# Patient Record
Sex: Male | Born: 1955 | Race: White | Hispanic: No | Marital: Married | Smoking: Never smoker
Health system: Southern US, Community
[De-identification: ages and names within clinical notes are randomized; demographics above are authoritative.]

## PROBLEM LIST (undated history)

## (undated) DIAGNOSIS — I499 Cardiac arrhythmia, unspecified: Secondary | ICD-10-CM

## (undated) DIAGNOSIS — I4891 Unspecified atrial fibrillation: Secondary | ICD-10-CM

## (undated) DIAGNOSIS — M1990 Unspecified osteoarthritis, unspecified site: Secondary | ICD-10-CM

## (undated) HISTORY — PX: SHOULDER ARTHROSCOPY: SHX128

## (undated) HISTORY — PX: CARDIOVERSION: SHX1299

---

## 2003-10-19 ENCOUNTER — Other Ambulatory Visit: Payer: Self-pay

## 2007-03-18 ENCOUNTER — Emergency Department: Payer: Self-pay | Admitting: Emergency Medicine

## 2007-03-27 ENCOUNTER — Other Ambulatory Visit: Payer: Self-pay

## 2007-03-27 ENCOUNTER — Ambulatory Visit: Payer: Self-pay | Admitting: Unknown Physician Specialty

## 2007-06-10 ENCOUNTER — Other Ambulatory Visit: Payer: Self-pay

## 2007-06-10 ENCOUNTER — Inpatient Hospital Stay: Payer: Self-pay | Admitting: Cardiology

## 2007-06-11 ENCOUNTER — Other Ambulatory Visit: Payer: Self-pay

## 2007-07-14 ENCOUNTER — Other Ambulatory Visit: Payer: Self-pay

## 2007-07-14 ENCOUNTER — Ambulatory Visit: Payer: Self-pay | Admitting: Cardiology

## 2007-07-18 ENCOUNTER — Ambulatory Visit: Payer: Self-pay | Admitting: Unknown Physician Specialty

## 2008-11-19 ENCOUNTER — Ambulatory Visit: Payer: Self-pay | Admitting: Cardiology

## 2009-08-02 ENCOUNTER — Ambulatory Visit: Payer: Self-pay | Admitting: Cardiology

## 2009-10-13 ENCOUNTER — Ambulatory Visit: Payer: Self-pay | Admitting: Cardiology

## 2009-11-02 ENCOUNTER — Ambulatory Visit: Payer: Self-pay | Admitting: Cardiology

## 2011-05-31 ENCOUNTER — Ambulatory Visit: Payer: Self-pay | Admitting: Unknown Physician Specialty

## 2012-07-19 ENCOUNTER — Emergency Department: Payer: Self-pay | Admitting: Internal Medicine

## 2012-08-07 ENCOUNTER — Ambulatory Visit: Payer: Self-pay | Admitting: Orthopedic Surgery

## 2012-08-18 ENCOUNTER — Ambulatory Visit: Payer: Self-pay | Admitting: Orthopedic Surgery

## 2012-08-18 LAB — CBC
HCT: 44.7 % (ref 40.0–52.0)
HGB: 14.7 g/dL (ref 13.0–18.0)
MCH: 28.9 pg (ref 26.0–34.0)
MCV: 88 fL (ref 80–100)
RBC: 5.07 10*6/uL (ref 4.40–5.90)

## 2012-08-18 LAB — URINALYSIS, COMPLETE
Bacteria: NONE SEEN
Bilirubin,UR: NEGATIVE
Glucose,UR: NEGATIVE mg/dL (ref 0–75)
Ketone: NEGATIVE
RBC,UR: <1 /HPF (ref 0–5)
Specific Gravity: 1.02 (ref 1.003–1.030)
Squamous Epithelial: NONE SEEN
WBC UR: NONE SEEN /HPF (ref 0–5)

## 2012-08-18 LAB — BASIC METABOLIC PANEL
Anion Gap: 8 (ref 7–16)
BUN: 18 mg/dL (ref 7–18)
Calcium, Total: 9.3 mg/dL (ref 8.5–10.1)
Chloride: 106 mmol/L (ref 98–107)
EGFR (African American): >60
EGFR (Non-African Amer.): >60
Glucose: 73 mg/dL (ref 65–99)
Potassium: 4.1 mmol/L (ref 3.5–5.1)

## 2012-08-18 LAB — APTT: Activated PTT: 28.6 secs (ref 23.6–35.9)

## 2012-08-18 LAB — SEDIMENTATION RATE: Erythrocyte Sed Rate: 6 mm/hr (ref 0–20)

## 2012-08-22 ENCOUNTER — Ambulatory Visit: Payer: Self-pay | Admitting: Orthopedic Surgery

## 2013-11-06 ENCOUNTER — Ambulatory Visit: Payer: Self-pay | Admitting: Unknown Physician Specialty

## 2013-11-23 LAB — PATHOLOGY REPORT

## 2014-07-17 ENCOUNTER — Emergency Department: Payer: Self-pay | Admitting: Internal Medicine

## 2015-03-21 ENCOUNTER — Encounter (HOSPITAL_BASED_OUTPATIENT_CLINIC_OR_DEPARTMENT_OTHER): Payer: Self-pay | Admitting: *Deleted

## 2015-03-21 NOTE — Progress Notes (Signed)
Copy of EKG req from Dr Bethanne Ginger office, pt with hx chronic a-fib. Will come in for BMET.

## 2015-03-22 ENCOUNTER — Encounter (HOSPITAL_BASED_OUTPATIENT_CLINIC_OR_DEPARTMENT_OTHER)
Admission: RE | Admit: 2015-03-22 | Discharge: 2015-03-22 | Disposition: A | Discharge status: Home / Self Care | Payer: Managed Care, Other (non HMO) | Source: Ambulatory Visit | Attending: Orthopedic Surgery | Admitting: Orthopedic Surgery

## 2015-03-22 DIAGNOSIS — Z6836 Body mass index (BMI) 36.0-36.9, adult: Secondary | ICD-10-CM | POA: Diagnosis not present

## 2015-03-22 DIAGNOSIS — M7741 Metatarsalgia, right foot: Secondary | ICD-10-CM | POA: Diagnosis present

## 2015-03-22 DIAGNOSIS — M1990 Unspecified osteoarthritis, unspecified site: Secondary | ICD-10-CM | POA: Diagnosis not present

## 2015-03-22 DIAGNOSIS — Z7982 Long term (current) use of aspirin: Secondary | ICD-10-CM | POA: Diagnosis not present

## 2015-03-22 DIAGNOSIS — I4891 Unspecified atrial fibrillation: Secondary | ICD-10-CM | POA: Diagnosis not present

## 2015-03-22 DIAGNOSIS — M24574 Contracture, right foot: Secondary | ICD-10-CM | POA: Diagnosis not present

## 2015-03-22 LAB — BASIC METABOLIC PANEL
Anion gap: 9 (ref 5–15)
BUN: 12 mg/dL (ref 6–20)
CHLORIDE: 105 mmol/L (ref 101–111)
CO2: 24 mmol/L (ref 22–32)
CREATININE: 1.05 mg/dL (ref 0.61–1.24)
Calcium: 9.6 mg/dL (ref 8.9–10.3)
GFR calc Af Amer: >60 mL/min (ref >60)
GLUCOSE: 132 mg/dL — AB (ref 65–99)
Potassium: 4.2 mmol/L (ref 3.5–5.1)
SODIUM: 138 mmol/L (ref 135–145)

## 2015-03-24 ENCOUNTER — Encounter (HOSPITAL_BASED_OUTPATIENT_CLINIC_OR_DEPARTMENT_OTHER)
Admission: RE | Disposition: A | Discharge status: Home / Self Care | Payer: Self-pay | Source: Ambulatory Visit | Attending: Orthopedic Surgery

## 2015-03-24 ENCOUNTER — Encounter (HOSPITAL_BASED_OUTPATIENT_CLINIC_OR_DEPARTMENT_OTHER): Payer: Self-pay | Admitting: *Deleted

## 2015-03-24 ENCOUNTER — Ambulatory Visit (HOSPITAL_BASED_OUTPATIENT_CLINIC_OR_DEPARTMENT_OTHER): Payer: Worker's Compensation | Admitting: Anesthesiology

## 2015-03-24 ENCOUNTER — Ambulatory Visit (HOSPITAL_BASED_OUTPATIENT_CLINIC_OR_DEPARTMENT_OTHER)
Admission: RE | Admit: 2015-03-24 | Discharge: 2015-03-24 | Disposition: A | Discharge status: Home / Self Care | Payer: Worker's Compensation | Source: Ambulatory Visit | Attending: Orthopedic Surgery | Admitting: Orthopedic Surgery

## 2015-03-24 DIAGNOSIS — M24574 Contracture, right foot: Secondary | ICD-10-CM | POA: Insufficient documentation

## 2015-03-24 DIAGNOSIS — I4891 Unspecified atrial fibrillation: Secondary | ICD-10-CM | POA: Diagnosis not present

## 2015-03-24 DIAGNOSIS — M1990 Unspecified osteoarthritis, unspecified site: Secondary | ICD-10-CM | POA: Insufficient documentation

## 2015-03-24 DIAGNOSIS — M7741 Metatarsalgia, right foot: Secondary | ICD-10-CM | POA: Diagnosis not present

## 2015-03-24 DIAGNOSIS — Z6836 Body mass index (BMI) 36.0-36.9, adult: Secondary | ICD-10-CM | POA: Insufficient documentation

## 2015-03-24 DIAGNOSIS — Z7982 Long term (current) use of aspirin: Secondary | ICD-10-CM | POA: Insufficient documentation

## 2015-03-24 HISTORY — DX: Unspecified atrial fibrillation: I48.91

## 2015-03-24 HISTORY — DX: Unspecified osteoarthritis, unspecified site: M19.90

## 2015-03-24 HISTORY — DX: Cardiac arrhythmia, unspecified: I49.9

## 2015-03-24 HISTORY — PX: WEIL OSTEOTOMY: SHX5044

## 2015-03-24 LAB — POCT HEMOGLOBIN-HEMACUE: Hemoglobin: 15.1 g/dL (ref 13.0–17.0)

## 2015-03-24 SURGERY — OSTEOTOMY, WEIL
Anesthesia: Regional | Site: Foot | Laterality: Right

## 2015-03-24 MED ORDER — DEXAMETHASONE SODIUM PHOSPHATE 4 MG/ML IJ SOLN
INTRAMUSCULAR | Status: DC | PRN
  Administered 2015-03-24: 10 mg via INTRAVENOUS

## 2015-03-24 MED ORDER — OXYCODONE HCL 5 MG PO TABS: 5.0000 mg | ORAL_TABLET | ORAL | Status: DC | PRN

## 2015-03-24 MED ORDER — MIDAZOLAM HCL 2 MG/2ML IJ SOLN
INTRAMUSCULAR | Status: AC
  Filled 2015-03-24: qty 2

## 2015-03-24 MED ORDER — LIDOCAINE HCL (CARDIAC) 20 MG/ML IV SOLN
INTRAVENOUS | Status: DC | PRN
  Administered 2015-03-24: 50 mg via INTRAVENOUS

## 2015-03-24 MED ORDER — BUPIVACAINE-EPINEPHRINE (PF) 0.5% -1:200000 IJ SOLN
INTRAMUSCULAR | Status: DC | PRN
  Administered 2015-03-24: 30 mL via PERINEURAL

## 2015-03-24 MED ORDER — CHLORHEXIDINE GLUCONATE 4 % EX LIQD
60.0000 mL | Freq: Once | CUTANEOUS | Status: DC
Start: 2015-03-24 — End: 2015-03-24

## 2015-03-24 MED ORDER — KETOROLAC TROMETHAMINE 30 MG/ML IJ SOLN: 30.0000 mg | Freq: Once | INTRAMUSCULAR | Status: DC | PRN

## 2015-03-24 MED ORDER — SODIUM CHLORIDE 0.9 % IV SOLN: INTRAVENOUS | Status: DC

## 2015-03-24 MED ORDER — LACTATED RINGERS IV SOLN
INTRAVENOUS | Status: DC
  Administered 2015-03-24: 11:00:00 via INTRAVENOUS

## 2015-03-24 MED ORDER — HYDROMORPHONE HCL 1 MG/ML IJ SOLN: 0.2500 mg | INTRAMUSCULAR | Status: DC | PRN

## 2015-03-24 MED ORDER — MIDAZOLAM HCL 2 MG/2ML IJ SOLN
1.0000 mg | INTRAMUSCULAR | Status: DC | PRN
  Administered 2015-03-24: 2 mg via INTRAVENOUS

## 2015-03-24 MED ORDER — PROPOFOL 10 MG/ML IV BOLUS
INTRAVENOUS | Status: DC | PRN
  Administered 2015-03-24: 175 mg via INTRAVENOUS

## 2015-03-24 MED ORDER — CEFAZOLIN SODIUM-DEXTROSE 2-3 GM-% IV SOLR
INTRAVENOUS | Status: AC
  Filled 2015-03-24: qty 50

## 2015-03-24 MED ORDER — FENTANYL CITRATE (PF) 100 MCG/2ML IJ SOLN
INTRAMUSCULAR | Status: AC
  Filled 2015-03-24: qty 2

## 2015-03-24 MED ORDER — PROMETHAZINE HCL 25 MG/ML IJ SOLN: 6.2500 mg | INTRAMUSCULAR | Status: DC | PRN

## 2015-03-24 MED ORDER — CEFAZOLIN SODIUM-DEXTROSE 2-3 GM-% IV SOLR
2.0000 g | INTRAVENOUS | Status: AC
  Administered 2015-03-24: 2 g via INTRAVENOUS

## 2015-03-24 MED ORDER — GLYCOPYRROLATE 0.2 MG/ML IJ SOLN: 0.2000 mg | Freq: Once | INTRAMUSCULAR | Status: DC | PRN

## 2015-03-24 MED ORDER — FENTANYL CITRATE (PF) 100 MCG/2ML IJ SOLN
50.0000 ug | INTRAMUSCULAR | Status: DC | PRN
  Administered 2015-03-24: 100 ug via INTRAVENOUS

## 2015-03-24 MED ORDER — HYDROCODONE-ACETAMINOPHEN 7.5-325 MG PO TABS: 1.0000 | ORAL_TABLET | Freq: Once | ORAL | Status: DC | PRN

## 2015-03-24 SURGICAL SUPPLY — 63 items
BANDAGE ESMARK 6X9 LF (GAUZE/BANDAGES/DRESSINGS) ×2 IMPLANT
BLADE AVERAGE 25MMX9MM (BLADE) ×2
BLADE AVERAGE 25X9 (BLADE) ×4 IMPLANT
BLADE SURG 15 STRL LF DISP TIS (BLADE) ×2 IMPLANT
BLADE SURG 15 STRL SS (BLADE) ×4
BNDG COHESIVE 4X5 TAN STRL (GAUZE/BANDAGES/DRESSINGS) ×3 IMPLANT
BNDG CONFORM 2 STRL LF (GAUZE/BANDAGES/DRESSINGS) IMPLANT
BNDG CONFORM 3 STRL LF (GAUZE/BANDAGES/DRESSINGS) IMPLANT
BNDG ESMARK 4X9 LF (GAUZE/BANDAGES/DRESSINGS) IMPLANT
BNDG ESMARK 6X9 LF (GAUZE/BANDAGES/DRESSINGS) ×6
CHLORAPREP W/TINT 26ML (MISCELLANEOUS) ×3 IMPLANT
COVER BACK TABLE 60X90IN (DRAPES) ×3 IMPLANT
CUFF TOURNIQUET SINGLE 24IN (TOURNIQUET CUFF) IMPLANT
DRAPE EXTREMITY T 121X128X90 (DRAPE) ×3 IMPLANT
DRAPE OEC MINIVIEW 54X84 (DRAPES) ×3 IMPLANT
DRAPE U-SHAPE 47X51 STRL (DRAPES) ×3 IMPLANT
DRSG EMULSION OIL 3X3 NADH (GAUZE/BANDAGES/DRESSINGS) IMPLANT
DRSG MEPITEL 4X7.2 (GAUZE/BANDAGES/DRESSINGS) IMPLANT
DRSG PAD ABDOMINAL 8X10 ST (GAUZE/BANDAGES/DRESSINGS) ×6 IMPLANT
ELECT REM PT RETURN 9FT ADLT (ELECTROSURGICAL) ×3
ELECTRODE REM PT RTRN 9FT ADLT (ELECTROSURGICAL) ×1 IMPLANT
GAUZE SPONGE 4X4 12PLY STRL (GAUZE/BANDAGES/DRESSINGS) ×3 IMPLANT
GLOVE BIO SURGEON STRL SZ8 (GLOVE) ×3 IMPLANT
GLOVE BIOGEL PI IND STRL 6.5 (GLOVE) ×1 IMPLANT
GLOVE BIOGEL PI IND STRL 7.0 (GLOVE) ×1 IMPLANT
GLOVE BIOGEL PI IND STRL 8 (GLOVE) ×1 IMPLANT
GLOVE BIOGEL PI INDICATOR 6.5 (GLOVE) ×2
GLOVE BIOGEL PI INDICATOR 7.0 (GLOVE) ×2
GLOVE BIOGEL PI INDICATOR 8 (GLOVE) ×2
GLOVE ECLIPSE 6.5 STRL STRAW (GLOVE) ×3 IMPLANT
GLOVE EXAM NITRILE MD LF STRL (GLOVE) ×3 IMPLANT
GLOVE SURG SS PI 6.5 STRL IVOR (GLOVE) ×3 IMPLANT
GOWN STRL REUS W/ TWL LRG LVL3 (GOWN DISPOSABLE) ×1 IMPLANT
GOWN STRL REUS W/ TWL XL LVL3 (GOWN DISPOSABLE) ×1 IMPLANT
GOWN STRL REUS W/TWL LRG LVL3 (GOWN DISPOSABLE) ×2
GOWN STRL REUS W/TWL XL LVL3 (GOWN DISPOSABLE) ×2
KIT SUT CPR VIP IMPL DBL PIGTA (Orthopedic Implant) ×3 IMPLANT
NEEDLE HYPO 22GX1.5 SAFETY (NEEDLE) IMPLANT
NS IRRIG 1000ML POUR BTL (IV SOLUTION) ×3 IMPLANT
PACK BASIN DAY SURGERY FS (CUSTOM PROCEDURE TRAY) ×3 IMPLANT
PAD CAST 4YDX4 CTTN HI CHSV (CAST SUPPLIES) ×1 IMPLANT
PADDING CAST ABS 4INX4YD NS (CAST SUPPLIES)
PADDING CAST ABS COTTON 4X4 ST (CAST SUPPLIES) IMPLANT
PADDING CAST COTTON 4X4 STRL (CAST SUPPLIES) ×2
PASSER SUT SWANSON 36MM LOOP (INSTRUMENTS) IMPLANT
PENCIL BUTTON HOLSTER BLD 10FT (ELECTRODE) ×3 IMPLANT
SANITIZER HAND PURELL 535ML FO (MISCELLANEOUS) ×3 IMPLANT
SCREW QUICK FIX 2X14 (Screw) ×6 IMPLANT
SHEET MEDIUM DRAPE 40X70 STRL (DRAPES) ×6 IMPLANT
SPONGE LAP 18X18 X RAY DECT (DISPOSABLE) ×3 IMPLANT
STOCKINETTE 6  STRL (DRAPES) ×2
STOCKINETTE 6 STRL (DRAPES) ×1 IMPLANT
SUCTION FRAZIER TIP 10 FR DISP (SUCTIONS) IMPLANT
SUT ETHILON 3 0 PS 1 (SUTURE) ×3 IMPLANT
SUT MNCRL AB 3-0 PS2 18 (SUTURE) ×3 IMPLANT
SUT VIC AB 2-0 SH 18 (SUTURE) IMPLANT
SYR BULB 3OZ (MISCELLANEOUS) ×3 IMPLANT
SYR CONTROL 10ML LL (SYRINGE) IMPLANT
TOWEL OR 17X24 6PK STRL BLUE (TOWEL DISPOSABLE) ×3 IMPLANT
TUBE CONNECTING 20'X1/4 (TUBING)
TUBE CONNECTING 20X1/4 (TUBING) IMPLANT
UNDERPAD 30X30 (UNDERPADS AND DIAPERS) ×3 IMPLANT
YANKAUER SUCT BULB TIP NO VENT (SUCTIONS) IMPLANT

## 2015-03-24 NOTE — Anesthesia Postprocedure Evaluation (Signed)
  Anesthesia Post-op Note  Patient: Cody Golden  Procedure(s) Performed: Procedure(s): 2ND -3RD METARSAL WEIL OSTEOTOMY,  PLANTAR PLATE REPAIR  (Right)  Patient Location: PACU  Anesthesia Type:GA combined with regional for post-op pain  Level of Consciousness: awake, alert  and oriented  Airway and Oxygen Therapy: Patient Spontanous Breathing  Post-op Pain: none  Post-op Assessment: Post-op Vital signs reviewed  Post-op Vital Signs: Reviewed  Last Vitals:  Filed Vitals:   03/24/15 1330  BP:   Pulse: 72  Temp:   Resp: 15    Complications: No apparent anesthesia complications

## 2015-03-24 NOTE — Anesthesia Procedure Notes (Addendum)
Anesthesia Regional Block:  Popliteal block  Pre-Anesthetic Checklist: ,, timeout performed, Correct Patient, Correct Site, Correct Laterality, Correct Procedure, Correct Position, site marked, Risks and benefits discussed,  Surgical consent,  Pre-op evaluation,  At surgeon's request and post-op pain management  Laterality: Right  Prep: chloraprep       Needles:  Injection technique: Single-shot  Needle Type: Echogenic Stimulator Needle     Needle Length: 9cm 9 cm Needle Gauge: 21 and 21 G    Additional Needles:  Procedures: ultrasound guided (picture in chart) and nerve stimulator Popliteal block  Nerve Stimulator or Paresthesia:  Response: tibial, 0.5 mA,  Response: peroneal, 0.5 mA,   Additional Responses:   Narrative:  Start time: 03/24/2015 11:15 AM End time: 03/24/2015 11:25 AM Injection made incrementally with aspirations every 5 mL.  Performed by: Personally  Anesthesiologist: Suzette Battiest  Additional Notes: Risks and benefits discussed. Pt tolerated well with no immediate complications.   Procedure Name: LMA Insertion Performed by: Terrance Mass Pre-anesthesia Checklist: Patient identified, Timeout performed, Emergency Drugs available, Suction available and Patient being monitored Patient Re-evaluated:Patient Re-evaluated prior to inductionOxygen Delivery Method: Circle system utilized Intubation Type: IV induction Ventilation: Mask ventilation without difficulty LMA: LMA inserted LMA Size: 5.0 Number of attempts: 1 Placement Confirmation: positive ETCO2 Tube secured with: Tape Dental Injury: Teeth and Oropharynx as per pre-operative assessment

## 2015-03-24 NOTE — H&P (Signed)
Cody Golden is an 59 y.o. male.   Chief Complaint: right forefoot pain HPI: 59 y/o male with painful right 2nd and 3rd metatarsal heads.  He may have a plantar plate tear as well.  He has failed non operative treatment and presents now for surgery to correct this painful condition.  Past Medical History  Diagnosis Date  . Dysrhythmia     A-fib  . Arthritis   . Atrial fibrillation     chronic    Past Surgical History  Procedure Laterality Date  . Cardioversion      x3 done years ago without conversion  . Shoulder arthroscopy      History reviewed. No pertinent family history. Social History:  reports that he has never smoked. He does not have any smokeless tobacco history on file. He reports that he does not drink alcohol or use illicit drugs.  Allergies: No Known Allergies  Medications Prior to Admission  Medication Sig Dispense Refill  . aspirin 325 MG tablet Take 325 mg by mouth daily.    Marland Kitchen diltiazem (CARDIZEM CD) 180 MG 24 hr capsule Take 180 mg by mouth daily.      No results found for this or any previous visit (from the past 48 hour(s)). No results found.  ROS  No recent f/c/n/v/wt loss  Blood pressure 139/86, pulse 68, temperature 97.8 F (36.6 C), temperature source Oral, resp. rate 18, height 5\' 11"  (1.803 m), weight 117.652 kg (259 lb 6 oz), SpO2 98 %. Physical Exam  wn wd male in nad.  A and Ox 4.  Mood and afect normal.  EOMI.  resp unlabored.  R foot with ttp at the 2nd and 3rd mt heads.  Some instability at the MPJ as well.  Skin healthy and intact.  1+ dp and pt pulses.  Assessment/Plan R 2nd and 3rd metatarsalgia and possible plantar plate tears.  To OR for 2nd and 3rd MT weil osteotomies and possible plantar plate repairs v. Hammertoe corrections.  The risks and benefits of the alternative treatment options have been discussed in detail.  The patient wishes to proceed with surgery and specifically understands risks of bleeding, infection, nerve damage,  blood clots, need for additional surgery, amputation and death.   Wylene Simmer 03/25/15, 11:33 AM

## 2015-03-24 NOTE — Brief Op Note (Signed)
03/24/2015  12:58 PM  PATIENT:  Cody Golden  59 y.o. male  PRE-OPERATIVE DIAGNOSIS: 1.  RIGHT 2ND-3RD METATERSALGIA      2.  POSSIBLE PLANTAR PLATE TEAR  POST-OPERATIVE DIAGNOSIS:   1.  RIGHT 2ND-3RD METATERSALGIA      2.  Right 2nd MTP joint plantar plate tear  Procedure(s): 1.  Right 2ND and 3RD METARSAL WEIL OSTEOTOMY,   2.  Right 2nd MTP joint PLANTAR PLATE REPAIR 3.  Right 2nd and 3rd MTP joint dorsal capsulotomy 4.  Right foot ap and lateral xrays   SURGEON:  Wylene Simmer, MD  ASSISTANT: n/a  ANESTHESIA:   General, regional  EBL:  minimal   TOURNIQUET:   Total Tourniquet Time Documented: Thigh (Right) - 36 minutes Total: Thigh (Right) - 36 minutes  COMPLICATIONS:  None apparent  DISPOSITION:  Extubated, awake and stable to recovery.  DICTATION ID:  [id number]

## 2015-03-24 NOTE — Progress Notes (Signed)
Assisted Dr. Rob Fitzgerald with right, ultrasound guided, popliteal block. Side rails up, monitors on throughout procedure. See vital signs in flow sheet. Tolerated Procedure well. 

## 2015-03-24 NOTE — Discharge Instructions (Signed)
Post Anesthesia Home Care Instructions  Activity: Get plenty of rest for the remainder of the day. A responsible adult should stay with you for 24 hours following the procedure.  For the next 24 hours, DO NOT: -Drive a car -Paediatric nurse -Drink alcoholic beverages -Take any medication unless instructed by your physician -Make any legal decisions or sign important papers.  Meals: Start with liquid foods such as gelatin or soup. Progress to regular foods as tolerated. Avoid greasy, spicy, heavy foods. If nausea and/or vomiting occur, drink only clear liquids until the nausea and/or vomiting subsides. Call your physician if vomiting continues.  Special Instructions/Symptoms: Your throat may feel dry or sore from the anesthesia or the breathing tube placed in your throat during surgery. If this causes discomfort, gargle with warm salt water. The discomfort should disappear within 24 hours.  If you had a scopolamine patch placed behind your ear for the management of post- operative nausea and/or vomiting:  1. The medication in the patch is effective for 72 hours, after which it should be removed.  Wrap patch in a tissue and discard in the trash. Wash hands thoroughly with soap and water. 2. You may remove the patch earlier than 72 hours if you experience unpleasant side effects which may include dry mouth, dizziness or visual disturbances. 3. Avoid touching the patch. Wash your hands with soap and water after contact with the patch.   Regional Anesthesia Blocks  1. Numbness or the inability to move the "blocked" extremity may last from 3-48 hours after placement. The length of time depends on the medication injected and your individual response to the medication. If the numbness is not going away after 48 hours, call your surgeon.  2. The extremity that is blocked will need to be protected until the numbness is gone and the  Strength has returned. Because you cannot feel it, you will need  to take extra care to avoid injury. Because it may be weak, you may have difficulty moving it or using it. You may not know what position it is in without looking at it while the block is in effect.  3. For blocks in the legs and feet, returning to weight bearing and walking needs to be done carefully. You will need to wait until the numbness is entirely gone and the strength has returned. You should be able to move your leg and foot normally before you try and bear weight or walk. You will need someone to be with you when you first try to ensure you do not fall and possibly risk injury.  4. Bruising and tenderness at the needle site are common side effects and will resolve in a few days.  5. Persistent numbness or new problems with movement should be communicated to the surgeon or the [facility] [phone] [place] [phone]).  Wylene Simmer, MD [place]  Please read the following information regarding your care after surgery.  Medications  You only need a prescription for the narcotic pain medicine (ex. oxycodone, Percocet, Norco).  All of the other medicines listed below are available over the counter. X acetominophen (Tylenol) 650 mg every 4-6 hours as you need for minor pain X oxycodone as prescribed for moderate to severe pain ?   Narcotic pain medicine (ex. oxycodone, Percocet, Vicodin) will cause constipation.  To prevent this problem, take the following medicines while you are taking any pain medicine. X docusate sodium (Colace) 100 mg twice a day X senna (  Senokot) 2 tablets twice a day  X To help prevent blood clots, take an aspirin (325 mg) once a day for a month after surgery.  You should also get up every hour while you are awake to move around.    Weight Bearing ? Bear weight when you are able on your operated leg or foot. X Bear weight only on the heel of your operated foot in the post-op shoe. ? Do not bear any weight on  the operated leg or foot.  Cast / Splint / Dressing X Keep your splint or cast clean and dry.  Dont put anything (coat hanger, pencil, etc) down inside of it.  If it gets damp, use a hair dryer on the cool setting to dry it.  If it gets soaked, call the office to schedule an appointment for a cast change. ? Remove your dressing 3 days after surgery and cover the incisions with dry dressings.    After your dressing, cast or splint is removed; you may shower, but do not soak or scrub the wound.  Allow the water to run over it, and then gently pat it dry.  Swelling It is normal for you to have swelling where you had surgery.  To reduce swelling and pain, keep your toes above your nose for at least 3 days after surgery.  It may be necessary to keep your foot or leg elevated for several weeks.  If it hurts, it should be elevated.  Follow Up Call my office at [phone] when you are discharged from the hospital or surgery center to schedule an appointment to be seen two weeks after surgery.  Call my office at [phone] if you develop a fever >101.5 F, nausea, vomiting, bleeding from the surgical site or severe pain.

## 2015-03-24 NOTE — Anesthesia Preprocedure Evaluation (Addendum)
Anesthesia Evaluation  Patient identified by MRN, date of birth, ID band Patient awake    Reviewed: Allergy & Precautions, NPO status , Patient's Chart, lab work & pertinent test results  Airway Mallampati: II  TM Distance: >3 FB Neck ROM: Full    Dental   Pulmonary neg pulmonary ROS,  breath sounds clear to auscultation        Cardiovascular + dysrhythmias Atrial Fibrillation Rhythm:Irregular Rate:Normal     Neuro/Psych negative neurological ROS     GI/Hepatic negative GI ROS, Neg liver ROS,   Endo/Other  Morbid obesity  Renal/GU negative Renal ROS     Musculoskeletal  (+) Arthritis -,   Abdominal   Peds  Hematology negative hematology ROS (+)   Anesthesia Other Findings   Reproductive/Obstetrics                            Anesthesia Physical Anesthesia Plan  ASA: II  Anesthesia Plan: General and Regional   Post-op Pain Management:    Induction: Intravenous  Airway Management Planned: LMA  Additional Equipment:   Intra-op Plan:   Post-operative Plan:   Informed Consent: I have reviewed the patients History and Physical, chart, labs and discussed the procedure including the risks, benefits and alternatives for the proposed anesthesia with the patient or authorized representative who has indicated his/her understanding and acceptance.     Plan Discussed with: CRNA  Anesthesia Plan Comments:         Anesthesia Quick Evaluation

## 2015-03-24 NOTE — Transfer of Care (Signed)
Immediate Anesthesia Transfer of Care Note  Patient: Cody Golden  Procedure(s) Performed: Procedure(s): 2ND -3RD METARSAL WEIL OSTEOTOMY,  PLANTAR PLATE REPAIR  (Right)  Patient Location: PACU  Anesthesia Type:General  Level of Consciousness: awake and sedated  Airway & Oxygen Therapy: Patient Spontanous Breathing and Patient connected to face mask oxygen  Post-op Assessment: Report given to RN and Post -op Vital signs reviewed and stable  Post vital signs: Reviewed and stable  Last Vitals:  Filed Vitals:   03/24/15 1126  BP:   Pulse: 68  Temp:   Resp: 18    Complications: No apparent anesthesia complications

## 2015-03-25 ENCOUNTER — Encounter (HOSPITAL_BASED_OUTPATIENT_CLINIC_OR_DEPARTMENT_OTHER): Payer: Self-pay | Admitting: Orthopedic Surgery

## 2015-03-25 NOTE — Op Note (Signed)
Cody Golden, BERENT NO.:  [id number]  MEDICAL RECORD NO.:  [id number]  LOCATION:                                FACILITY:  [facility]  PHYSICIAN:  Wylene Simmer, MD        DATE OF BIRTH:  08/06/1956  DATE OF PROCEDURE:  03/24/2015 DATE OF DISCHARGE:  03/24/2015                              OPERATIVE REPORT   PREOPERATIVE DIAGNOSES: 1. Right second and third metatarsalgia. 2. Right second metatarsophalangeal joint possible plantar plate tear.  POSTOPERATIVE DIAGNOSES: 1. Right second and third metatarsalgia with dorsal joint capsule     contracture. 2. Right second metatarsophalangeal joint plantar plate tear.  PROCEDURES: 1. Right second and third metatarsal Weil osteotomies through separate     incisions. 2. Right second MTP joint plantar plate repair. 3. Right second and third MTP joint dorsal capsulotomies. 4. Right foot AP and lateral radiographs.  SURGEON:  Wylene Simmer, MD  ANESTHESIA:  General, regional.  ESTIMATED BLOOD LOSS:  Minimal.  TOURNIQUET TIME:  36 minutes at 250 mmHg.  COMPLICATIONS:  None apparent.  DISPOSITION:  Extubated, awake, and stable to recovery.  INDICATIONS FOR PROCEDURE:  The patient is a 59 year old male, who has significant right forefoot pain after an injury at work.  He is found to have second and third metatarsalgia, and a possible plantar plate tear at the second MTP joint.  He presents now for operative treatment of these painful conditions.  He understands the risks and benefits, the alternative treatment options, and elects surgical treatment.  He specifically understands risks of bleeding, infection, nerve damage, blood clots, need for additional surgery, continued pain, recurrence of his deformities, amputation, and death.  PROCEDURE IN DETAIL:  After preoperative consent was obtained and the correct operative site was identified, the patient was brought to the operating room and placed supine on the operating  table.  General anesthesia was induced.  Preoperative antibiotics were administered. Surgical time-out was taken.  Right lower extremity was prepped and draped in standard sterile fashion with tourniquet around the thigh. Extremity was exsanguinated and the tourniquet was inflated to 250 mmHg. A longitudinal incision was made over the second MTP joint.  Sharp dissection was carried down through skin and subcutaneous tissue.  The dorsal joint capsule was identified.  The extensor tendons were protected.  The joint capsule was excised in its entirety.  This allowed appropriate plantar flexion of the toe.  The metatarsal head was exposed and a Weil osteotomy was made with the oscillating saw.  The metatarsal head was allowed to retract proximally.  This afforded an appropriate view at the plantar plate.  The plantar plate was noted to be torn over its lateral half.  The McGlamry elevator was used to release the plantar plate proximally and distally.  This allowed appropriate excursion of the plantar plate.  The Arthrex plantar plate repair kit was then utilized to insert 2-0 FiberWire sutures into the leading edge of the plantar plate.  Holes were drilled on the base of the proximal phalanx and the sutures were pulled up through these holes with a suture passer. This advanced the plantar plate up to the base of  the proximal phalanx in an appropriate anatomic position.  The sutures were tied.  The metatarsal head was then positioned appropriately and a 2-mm QuickFix screw was inserted fixing the osteotomy appropriately.  The overhanging bone was trimmed with a rongeur.  Attention was then turned to the second MTP joint where a longitudinal incision was made.  Sharp dissection was carried down through skin and subcutaneous tissue.  Dorsal capsulotomy was performed and the dorsal joint capsule was excised in its entirety.  This allowed appropriate passive positioning of the toe passed in the  neutral position.  The Weil osteotomy was again made with the oscillating saw and the head was allowed to retract proximally.  The plantar plate was carefully inspected and there was no evidence of any tearing.  The osteotomy was then fixed with a 2 mm QuickFix screw.  Overhanging bone was trimmed with a rongeur.  AP and lateral radiographs confirmed appropriate length of the second and third metatarsals and appropriate position length of all hardware.  Wounds were irrigated copiously.  Dorsal incisions were closed with Monocryl and nylon.  Sterile dressings were applied followed by a well-padded compression wrap.  Tourniquet was released at 36 minutes.  The patient was awakened from anesthesia and transported to the recovery room in stable condition.  FOLLOWUP PLAN:  The patient will be weightbearing as tolerated on his heel and a Darco wedge style shoe.  He will follow up with me in 2 weeks for suture removal.  X-RAYS:  AP and lateral right foot are obtained intraoperatively.  These show interval correction of long second and third metatarsals with appropriately positioned hardware.  No other acute injuries noted.     Wylene Simmer, MD     JH/MEDQ  D:  03/24/2015  T:  03/25/2015  Job:  [id number]

## 2015-06-27 IMAGING — CR RIGHT FOOT COMPLETE - 3+ VIEW
1 series · 3 of 3 positions shown · non-contrast
Comparison: None.

CLINICAL DATA: Right great toe pain, bruising and swelling
following an injury.

EXAM:
RIGHT FOOT COMPLETE - 3+ VIEW

[Series 1: ap · 0.17mm/px · 3 of 3 slices shown]
[im 1/3]
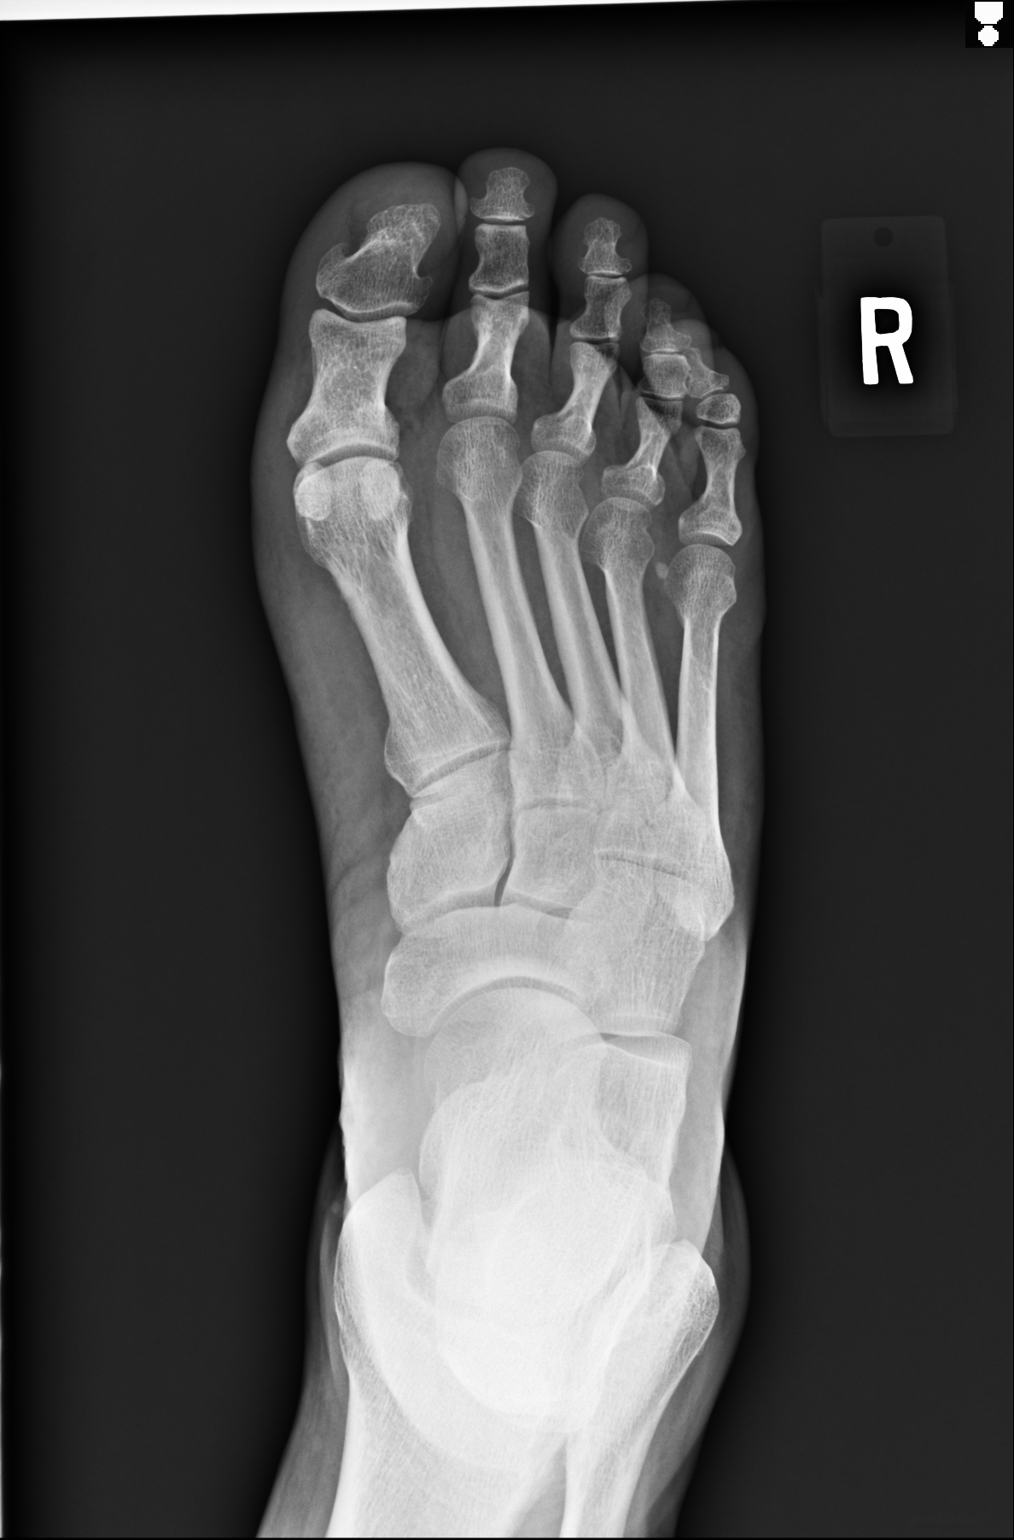
[im 2/3]
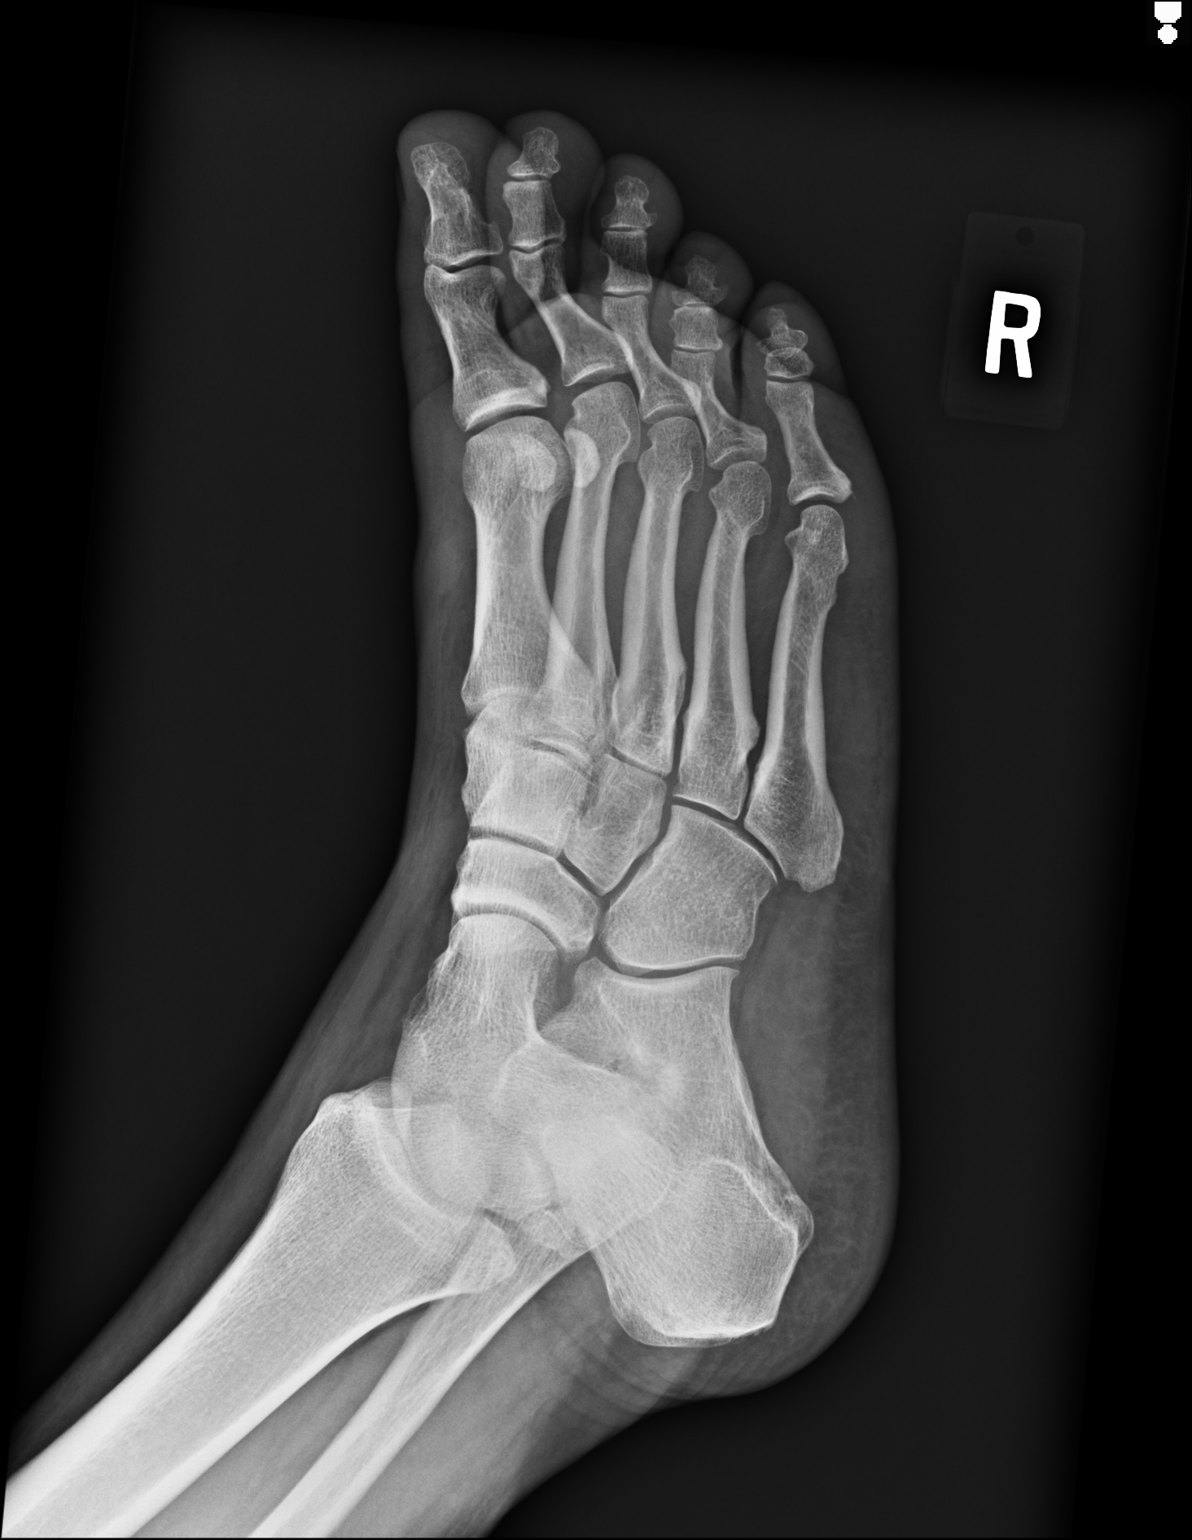
[im 3/3]
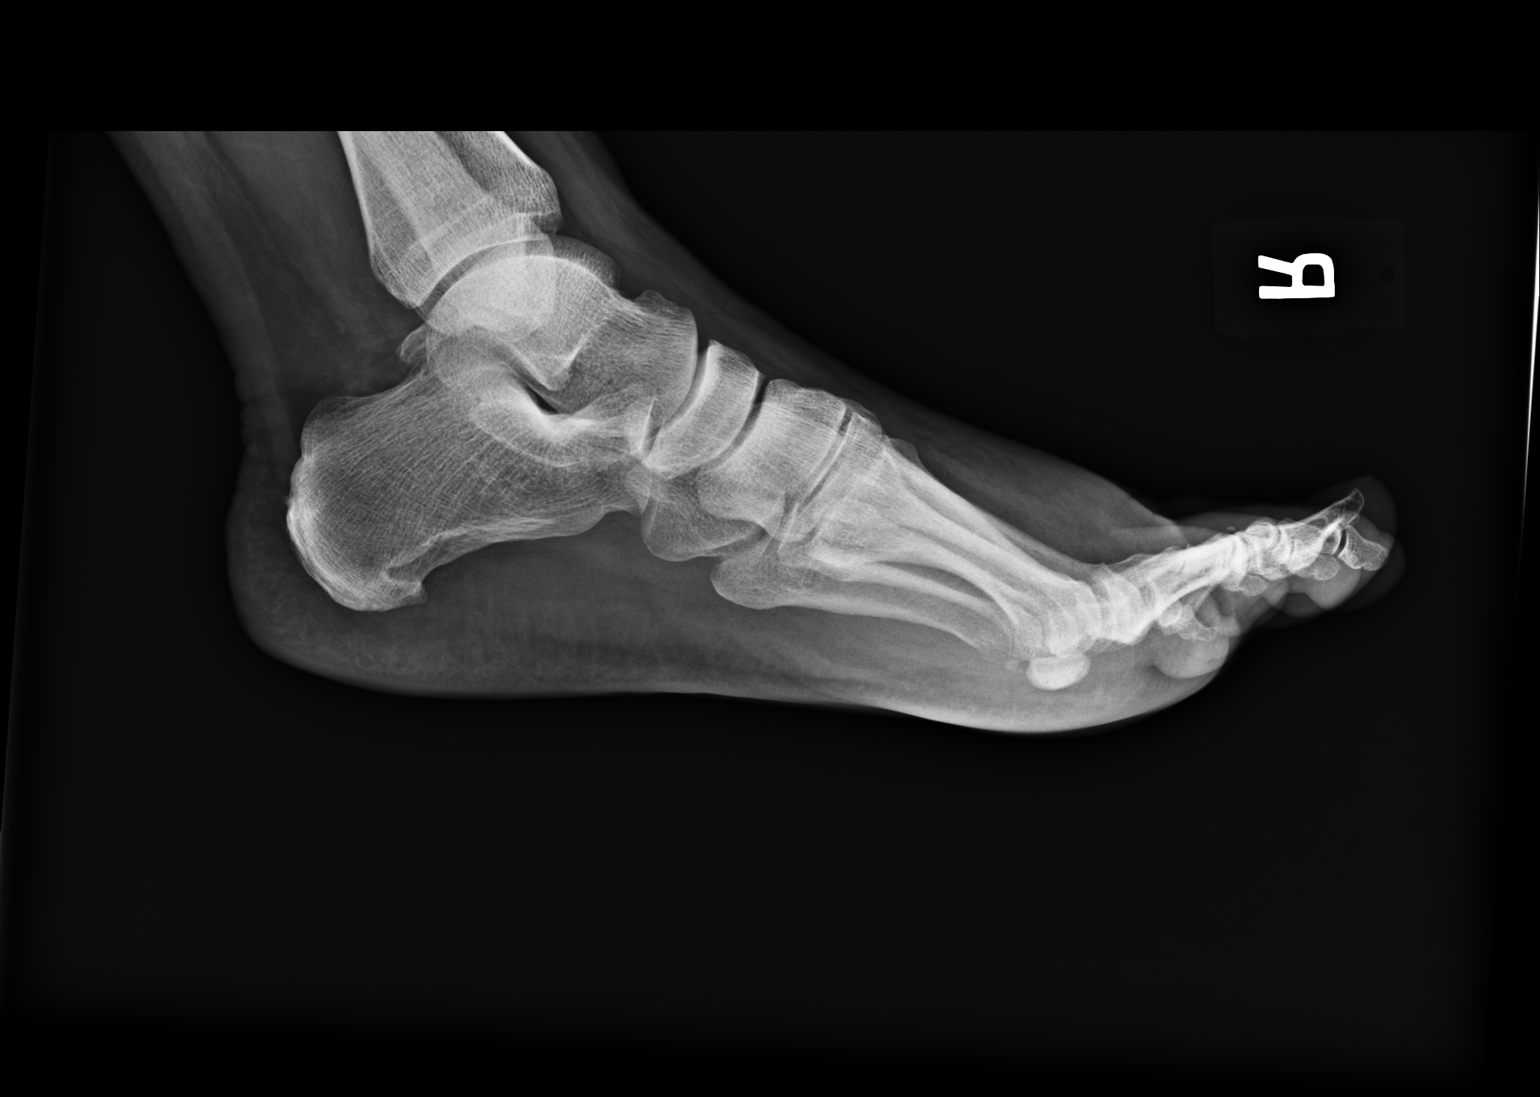

[3 of 3 positions shown; findings below may reference images not displayed]

FINDINGS: Small curvilinear calcific density projected dorsal to the toes on
the lateral view. Distal soft tissue swelling. Mild hallux valgus.
Mild calcaneal spur formation.
IMPRESSION: Distal soft tissue swelling and small, curvilinear calcification or
avulsion fracture fragment dorsal to the toes on the lateral view.
Dedicated right to radiographs are recommended to get a lateral view
of the great toe without overlapping of the other toes.

## 2019-11-02 ENCOUNTER — Ambulatory Visit: Payer: Medicare Other | Attending: Internal Medicine

## 2019-11-02 DIAGNOSIS — U071 COVID-19: Secondary | ICD-10-CM | POA: Insufficient documentation

## 2019-11-02 DIAGNOSIS — Z20822 Contact with and (suspected) exposure to covid-19: Secondary | ICD-10-CM

## 2019-11-02 HISTORY — DX: COVID-19: U07.1

## 2019-11-03 LAB — NOVEL CORONAVIRUS, NAA: SARS-CoV-2, NAA: DETECTED — AB

## 2019-11-04 ENCOUNTER — Telehealth: Payer: Self-pay | Admitting: Nurse Practitioner

## 2019-11-04 NOTE — Telephone Encounter (Signed)
Called to Discuss with patient about Covid symptoms and the use of bamlanivimab, a monoclonal antibody infusion for those with mild to moderate Covid symptoms and at a high risk of hospitalization.     Pt is qualified for this infusion at the [facility] center due to co-morbid conditions and/or a member of an at-risk group.     Unable to reach pt  

## 2020-05-20 ENCOUNTER — Other Ambulatory Visit: Payer: Self-pay

## 2020-05-20 ENCOUNTER — Ambulatory Visit: Payer: Self-pay | Admitting: Family Medicine

## 2020-05-20 VITALS — HR 93 | Resp 17 | Ht 69.0 in | Wt 252.2 lb

## 2020-05-20 DIAGNOSIS — Z024 Encounter for examination for driving license: Secondary | ICD-10-CM | POA: Insufficient documentation

## 2020-05-20 LAB — POCT URINALYSIS DIPSTICK
Bilirubin, UA: NEGATIVE
Blood, UA: NEGATIVE
Glucose, UA: NEGATIVE
Ketones, UA: NEGATIVE
Leukocytes, UA: NEGATIVE
Nitrite, UA: NEGATIVE
Protein, UA: NEGATIVE
Spec Grav, UA: 1.01 (ref 1.010–1.025)
Urobilinogen, UA: 0.2 E.U./dL
pH, UA: 5 (ref 5.0–8.0)

## 2020-05-20 NOTE — Progress Notes (Signed)
Subjective:    Patient ID: Cody Golden, male    DOB: 1956/10/18, 64 y.o.   MRN: [id number]  Cody Golden is a 64 y.o. 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  Social History   Tobacco Use  . Smoking status: Never Smoker  Substance Use Topics  . Alcohol use: No  . Drug use: No    Review of Systems  Constitutional: Negative.   HENT: Negative.   Eyes: Negative.   Respiratory: Negative.   Cardiovascular: Negative.   Gastrointestinal: Negative.   Endocrine: Negative.   Genitourinary: Negative.   Musculoskeletal: Negative.   Skin: Negative.   Allergic/Immunologic: Negative.   Neurological: Negative.   Hematological: Negative.   Psychiatric/Behavioral: Negative.    Per HPI unless specifically indicated above     Objective:    Pulse 93   Resp 17   Ht 5\' 9"  (1.753 m)   Wt (!) 252 lb 3.2 oz (114.4 kg)   SpO2 100%   BMI 37.24 kg/m   Wt Readings from Last 3 Encounters:  05/20/20 (!) 252 lb 3.2 oz (114.4 kg)  03/24/15 259 lb 6 oz (117.7 kg)    Physical Exam Vitals reviewed.  Constitutional:      General: He is not in acute distress.    Appearance: Normal appearance. He is well-developed and well-groomed. He is obese. He is not ill-appearing or toxic-appearing.  HENT:     Head: Normocephalic.     Right Ear: Tympanic membrane, ear canal and external ear normal. There is no impacted cerumen.     Left Ear: Tympanic membrane, ear canal and external ear normal. There is no impacted cerumen.     Nose: Nose normal. No congestion or rhinorrhea.     Mouth/Throat:     Mouth: Mucous membranes are moist.     Pharynx: Oropharynx is clear. No oropharyngeal exudate or posterior oropharyngeal erythema.  Eyes:     General: Lids are normal. No scleral icterus.       Right eye: No discharge.        Left eye: No discharge.     Extraocular Movements: Extraocular movements intact.      Conjunctiva/sclera: Conjunctivae normal.     Pupils: Pupils are equal, round, and reactive to light.     Comments: Vision waiver from [facility] provided  Cardiovascular:     Rate and Rhythm: Normal rate and regular rhythm.     Pulses: Normal pulses.          Dorsalis pedis pulses are 2+ on the right side and 2+ on the left side.     Heart sounds: Normal heart sounds. No murmur heard.  No friction rub. No gallop.   Pulmonary:     Effort: Pulmonary effort is normal. No respiratory distress.     Breath sounds: Normal breath sounds. No wheezing, rhonchi or rales.  Abdominal:     General: Abdomen is flat. Bowel sounds are normal. There is no distension.     Palpations: Abdomen is soft. There is no hepatomegaly, splenomegaly or mass.     Tenderness: There is no abdominal tenderness. There is no right CVA tenderness, left CVA tenderness, guarding or rebound.     Hernia: No hernia is present.  Musculoskeletal:        General: Normal range of motion.     Cervical back: Normal range of motion and neck supple.  No rigidity or tenderness.     Right lower leg: No edema.     Left lower leg: No edema.     Comments: Normal tone, 5/5 strength BUE & BLE  Feet:     Right foot:     Skin integrity: Skin integrity normal.     Left foot:     Skin integrity: Skin integrity normal.  Lymphadenopathy:     Cervical: No cervical adenopathy.  Skin:    General: Skin is warm and dry.     Capillary Refill: Capillary refill takes less than 2 seconds.  Neurological:     General: No focal deficit present.     Mental Status: He is alert and oriented to person, place, and time.     Cranial Nerves: Cranial nerves are intact. No cranial nerve deficit.     Sensory: Sensation is intact. No sensory deficit.     Motor: Motor function is intact. No weakness.     Coordination: Coordination is intact. Coordination normal.     Gait: Gait is intact. Gait normal.     Deep Tendon Reflexes: Reflexes are normal and symmetric.  Reflexes normal.  Psychiatric:        Attention and Perception: Attention and perception normal.        Mood and Affect: Mood and affect normal.        Speech: Speech normal.        Behavior: Behavior normal. Behavior is cooperative.        Thought Content: Thought content normal.        Cognition and Memory: Cognition and memory normal.        Judgment: Judgment normal.    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  Afib & HTN, cardiac clearance provided by Dr. Sydnee Levans at [facility], [place] in [place], [place]      Relevant Orders   POCT Urinalysis Dipstick (Completed)      No orders of the defined types were placed in this encounter.     Follow up plan: Return in about 1 year (around 05/20/2021) for DOT PE.   [name], [place] [facility]ition[facility] 05/20/2020, 2:16 PM

## 2020-05-20 NOTE — Assessment & Plan Note (Signed)
DOT Certificate provided x 1 year.  Afib & HTN, cardiac clearance provided by Dr. Sydnee Levans at [facility], [place] in [place], [place]

## 2020-05-20 NOTE — Patient Instructions (Signed)
DOT certificate provided x 1 year 

## 2020-09-19 ENCOUNTER — Other Ambulatory Visit: Payer: Self-pay | Admitting: Ophthalmology

## 2020-11-16 ENCOUNTER — Other Ambulatory Visit: Payer: Self-pay

## 2020-11-16 ENCOUNTER — Encounter: Payer: Self-pay | Admitting: Ophthalmology

## 2020-11-17 ENCOUNTER — Other Ambulatory Visit
Admission: RE | Admit: 2020-11-17 | Discharge: 2020-11-17 | Disposition: A | Discharge status: Home / Self Care | Payer: Medicare PPO | Source: Ambulatory Visit | Attending: Ophthalmology | Admitting: Ophthalmology

## 2020-11-17 DIAGNOSIS — Z01812 Encounter for preprocedural laboratory examination: Secondary | ICD-10-CM | POA: Insufficient documentation

## 2020-11-17 DIAGNOSIS — U071 COVID-19: Secondary | ICD-10-CM | POA: Diagnosis not present

## 2020-11-17 LAB — SARS CORONAVIRUS 2 (TAT 6-24 HRS): SARS Coronavirus 2: POSITIVE — AB

## 2020-11-17 NOTE — Discharge Instructions (Signed)

## 2020-11-18 ENCOUNTER — Telehealth: Payer: Self-pay

## 2020-11-18 NOTE — Telephone Encounter (Signed)
Called to discuss with patient about COVID-19 symptoms and the use of one of the available treatments for those with mild to moderate Covid symptoms and at a high risk of hospitalization.  Pt appears to qualify for outpatient treatment due to co-morbid conditions and/or a member of an at-risk group in accordance with the FDA Emergency Use Authorization.    Symptom onset: Unknown Vaccinated: Unknown Booster? Unknown Immunocompromised? No Qualifiers: HTN, BMI >25  Unable to reach pt - Left message with call back number [phone].   Cody Golden

## 2020-11-24 ENCOUNTER — Other Ambulatory Visit: Payer: Medicare PPO | Attending: Ophthalmology

## 2020-11-28 ENCOUNTER — Ambulatory Visit
Admission: RE | Admit: 2020-11-28 | Discharge: 2020-11-28 | Disposition: A | Discharge status: Home / Self Care | Payer: Medicare PPO | Attending: Ophthalmology | Admitting: Ophthalmology

## 2020-11-28 ENCOUNTER — Other Ambulatory Visit: Payer: Self-pay

## 2020-11-28 ENCOUNTER — Ambulatory Visit: Payer: Medicare PPO | Admitting: Anesthesiology

## 2020-11-28 ENCOUNTER — Encounter: Payer: Self-pay | Admitting: Ophthalmology

## 2020-11-28 ENCOUNTER — Encounter
Admission: RE | Disposition: A | Discharge status: Home / Self Care | Payer: Self-pay | Source: Home / Self Care | Attending: Ophthalmology

## 2020-11-28 DIAGNOSIS — Z7982 Long term (current) use of aspirin: Secondary | ICD-10-CM | POA: Insufficient documentation

## 2020-11-28 DIAGNOSIS — H401112 Primary open-angle glaucoma, right eye, moderate stage: Secondary | ICD-10-CM | POA: Diagnosis not present

## 2020-11-28 DIAGNOSIS — Z8616 Personal history of COVID-19: Secondary | ICD-10-CM | POA: Insufficient documentation

## 2020-11-28 DIAGNOSIS — H2511 Age-related nuclear cataract, right eye: Secondary | ICD-10-CM | POA: Insufficient documentation

## 2020-11-28 HISTORY — PX: CATARACT EXTRACTION W/PHACO: SHX586

## 2020-11-28 SURGERY — PHACOEMULSIFICATION, CATARACT, WITH IOL INSERTION
Anesthesia: Monitor Anesthesia Care | Site: Eye | Laterality: Right

## 2020-11-28 MED ORDER — SODIUM HYALURONATE 10 MG/ML IO SOLN
INTRAOCULAR | Status: DC | PRN
  Administered 2020-11-28: 0.55 mL via INTRAOCULAR

## 2020-11-28 MED ORDER — FENTANYL CITRATE (PF) 100 MCG/2ML IJ SOLN
INTRAMUSCULAR | Status: DC | PRN
  Administered 2020-11-28: 50 ug via INTRAVENOUS

## 2020-11-28 MED ORDER — MOXIFLOXACIN HCL 0.5 % OP SOLN
OPHTHALMIC | Status: DC | PRN
  Administered 2020-11-28: 0.2 mL via OPHTHALMIC

## 2020-11-28 MED ORDER — ARMC OPHTHALMIC DILATING DROPS
1.0000 "application " | OPHTHALMIC | Status: DC | PRN
  Administered 2020-11-28 (×3): 1 via OPHTHALMIC

## 2020-11-28 MED ORDER — ACETAMINOPHEN 325 MG PO TABS: 325.0000 mg | ORAL_TABLET | Freq: Once | ORAL | Status: DC

## 2020-11-28 MED ORDER — MIDAZOLAM HCL 2 MG/2ML IJ SOLN
INTRAMUSCULAR | Status: DC | PRN
  Administered 2020-11-28: 2 mg via INTRAVENOUS

## 2020-11-28 MED ORDER — BRIMONIDINE TARTRATE-TIMOLOL 0.2-0.5 % OP SOLN
OPHTHALMIC | Status: DC | PRN
  Administered 2020-11-28: 1 [drp] via OPHTHALMIC

## 2020-11-28 MED ORDER — LIDOCAINE HCL (PF) 2 % IJ SOLN
INTRAOCULAR | Status: DC | PRN
  Administered 2020-11-28: 4 mL via INTRAOCULAR

## 2020-11-28 MED ORDER — SODIUM HYALURONATE 23 MG/ML IO SOLN
INTRAOCULAR | Status: DC | PRN
  Administered 2020-11-28: 0.6 mL via INTRAOCULAR

## 2020-11-28 MED ORDER — EPINEPHRINE PF 1 MG/ML IJ SOLN
INTRAOCULAR | Status: DC | PRN
  Administered 2020-11-28: 50 mL via OPHTHALMIC

## 2020-11-28 MED ORDER — ACETAMINOPHEN 160 MG/5ML PO SOLN: 325.0000 mg | Freq: Once | ORAL | Status: DC

## 2020-11-28 MED ORDER — LACTATED RINGERS IV SOLN: INTRAVENOUS | Status: DC

## 2020-11-28 MED ORDER — TETRACAINE HCL 0.5 % OP SOLN
1.0000 [drp] | OPHTHALMIC | Status: DC | PRN
  Administered 2020-11-28 (×3): 1 [drp] via OPHTHALMIC

## 2020-11-28 SURGICAL SUPPLY — 20 items
CANNULA ANT/CHMB 27GA (MISCELLANEOUS) ×4 IMPLANT
DEVICE INJECT ISTENT W (Stent) ×1 IMPLANT
DISSECTOR HYDRO NUCLEUS 50X22 (MISCELLANEOUS) ×2 IMPLANT
GLOVE SURG LX 7.5 STRW (GLOVE) ×1
GLOVE SURG LX STRL 7.5 STRW (GLOVE) ×1 IMPLANT
GLOVE SURG SYN 8.5  E (GLOVE) ×1
GLOVE SURG SYN 8.5 E (GLOVE) ×1 IMPLANT
GOWN STRL REUS W/ TWL LRG LVL3 (GOWN DISPOSABLE) ×2 IMPLANT
GOWN STRL REUS W/TWL LRG LVL3 (GOWN DISPOSABLE) ×4
ICLIP (OPHTHALMIC RELATED) ×2 IMPLANT
INJECT ISTENT W (Stent) ×2 IMPLANT
LENS IOL TECNIS EYHANCE 16.5 (Intraocular Lens) ×2 IMPLANT
MARKER SKIN DUAL TIP RULER LAB (MISCELLANEOUS) ×2 IMPLANT
PACK DR. KING ARMS (PACKS) ×2 IMPLANT
PACK EYE AFTER SURG (MISCELLANEOUS) ×2 IMPLANT
PACK OPTHALMIC (MISCELLANEOUS) ×2 IMPLANT
SYR 3ML LL SCALE MARK (SYRINGE) ×2 IMPLANT
SYR TB 1ML LUER SLIP (SYRINGE) ×2 IMPLANT
WATER STERILE IRR 250ML POUR (IV SOLUTION) ×2 IMPLANT
WIPE NON LINTING 3.25X3.25 (MISCELLANEOUS) ×2 IMPLANT

## 2020-11-28 NOTE — Anesthesia Procedure Notes (Signed)
Procedure Name: MAC Date/Time: 11/28/2020 10:52 AM Performed by: Silvana Newness, CRNA Pre-anesthesia Checklist: Patient identified, Emergency Drugs available, Suction available, Patient being monitored and Timeout performed Patient Re-evaluated:Patient Re-evaluated prior to induction Oxygen Delivery Method: Nasal cannula Placement Confirmation: positive ETCO2

## 2020-11-28 NOTE — H&P (Signed)
[facility]   Primary Care Physician:  Derinda Late, MD Ophthalmologist: Dr. Benay Pillow  Pre-Procedure History & Physical: HPI:  Cody Golden is a 65 y.o. male here for cataract surgery.   Past Medical History:  Diagnosis Date  . Arthritis   . Atrial fibrillation (HCC)    chronic  . COVID-19 11/02/2019  . Dysrhythmia    A-fib    Past Surgical History:  Procedure Laterality Date  . CARDIOVERSION     x3 done years ago without conversion  . SHOULDER ARTHROSCOPY    . WEIL OSTEOTOMY Right 03/24/2015   Procedure: 2ND -3RD METARSAL WEIL OSTEOTOMY,  PLANTAR PLATE REPAIR ;  Surgeon: Wylene Simmer, MD;  Location: [facility];  Service: Orthopedics;  Laterality: Right;    Prior to Admission medications   Medication Sig Start Date End Date Taking? Authorizing Provider  aspirin 81 MG chewable tablet Chew by mouth daily.   Yes [provider]  co-enzyme Q-10 30 MG capsule Take 30 mg by mouth daily.   Yes [provider]  latanoprost (XALATAN) 0.005 % ophthalmic solution Place 1 drop into both eyes at bedtime.   Yes [provider]  losartan (COZAAR) 25 MG tablet Take 25 mg by mouth daily as needed.   Yes [provider]  Multiple Vitamin (MULTIVITAMIN) tablet Take 1 tablet by mouth daily.   Yes [provider]  timolol (BETIMOL) 0.5 % ophthalmic solution Place 1 drop into both eyes daily.   Yes [provider]    Allergies as of 09/05/2020  . (No Known Allergies)    History reviewed. No pertinent family history.  Social History   Socioeconomic History  . Marital status: Married    Spouse name: Not on file  . Number of children: Not on file  . Years of education: Not on file  . Highest education level: Not on file  Occupational History  . Not on file  Tobacco Use  . Smoking status: Never Smoker  . Smokeless tobacco: Never Used  Vaping Use  . Vaping Use: Never used  Substance and Sexual Activity   . Alcohol use: No  . Drug use: No  . Sexual activity: Not on file  Other Topics Concern  . Not on file  Social History Narrative  . Not on file   Social Determinants of Health   Financial Resource Strain: Not on file  Food Insecurity: Not on file  Transportation Needs: Not on file  Physical Activity: Not on file  Stress: Not on file  Social Connections: Not on file  Intimate Partner Violence: Not on file    Review of Systems: See HPI, otherwise negative ROS  Physical Exam: BP (!) 149/106   Pulse 81   Temp 97.6 F (36.4 C) (Temporal)   Resp 16   Ht 5\' 9"  (1.753 m)   Wt 114.8 kg   SpO2 98%   BMI 37.36 kg/m  General:   Alert,  pleasant and cooperative in NAD Head:  Normocephalic and atraumatic. Respiratory:  Normal work of breathing.  Impression/Plan: Cody Golden is here for cataract surgery.  Risks, benefits, limitations, and alternatives regarding cataract surgery have been reviewed with the patient.  Questions have been answered.  All parties agreeable.   Benay Pillow, MD  11/28/2020, 10:36 AM

## 2020-11-28 NOTE — Anesthesia Postprocedure Evaluation (Signed)
Anesthesia Post Note  Patient: Cody Golden  Procedure(s) Performed: CATARACT EXTRACTION PHACO AND INTRAOCULAR LENS PLACEMENT (IOC) RIGHT ISTENT INJ 1.80 00:16.1 (Right Eye)     Patient location during evaluation: PACU Anesthesia Type: MAC Level of consciousness: awake and alert and oriented Pain management: satisfactory to patient Vital Signs Assessment: post-procedure vital signs reviewed and stable Respiratory status: spontaneous breathing, nonlabored ventilation and respiratory function stable Cardiovascular status: blood pressure returned to baseline and stable Postop Assessment: Adequate PO intake and No signs of nausea or vomiting Anesthetic complications: no   No complications documented.  Raliegh Ip

## 2020-11-28 NOTE — Transfer of Care (Signed)
Immediate Anesthesia Transfer of Care Note  Patient: Cody Golden  Procedure(s) Performed: CATARACT EXTRACTION PHACO AND INTRAOCULAR LENS PLACEMENT (IOC) RIGHT ISTENT INJ 1.80 00:16.1 (Right Eye)  Patient Location: PACU  Anesthesia Type: MAC  Level of Consciousness: awake, alert  and patient cooperative  Airway and Oxygen Therapy: Patient Spontanous Breathing and Patient connected to supplemental oxygen  Post-op Assessment: Post-op Vital signs reviewed, Patient's Cardiovascular Status Stable, Respiratory Function Stable, Patent Airway and No signs of Nausea or vomiting  Post-op Vital Signs: Reviewed and stable  Complications: No complications documented.

## 2020-11-28 NOTE — Op Note (Signed)
OPERATIVE NOTE  Cody Golden [id number] 11/28/2020  PREOPERATIVE DIAGNOSIS:   1. Moderate PRIMARY open angle glaucoma, right eye.  W97.9892 2. Nuclear sclerotic cataract right eye.  H25.11   POSTOPERATIVE DIAGNOSIS:    same.   PROCEDURE:   1.  Placement of trabecular bypass stent and phacoemusification with posterior chamber intraocular lens placement of the right eye  CPT [phone]   LENS: Implant Name Type Inv. Item Serial No. Manufacturer Lot No. LRB No. Used Action  Marko Stai - [id number] US0012 Stent INJECT Jacquenette Shone [id number] US0012 [facility] [employer] [id number] Right [address] Jacquenette Shone [place] [id number] US0011 Stent Marko Stai [id number] US0011 [employer] [id number] Right 1 Implanted  LENS IOL TECNIS EYHANCE 16.5 - [id number] Intraocular Lens LENS IOL TECNIS EYHANCE 16.5 [id number] JOHNSON   Right 1 Implanted      Procedure(s) with comments: CATARACT EXTRACTION PHACO AND INTRAOCULAR LENS PLACEMENT (IOC) RIGHT ISTENT INJ 1.80 00:16.1 (Right) - covid + 11-16-20    ULTRASOUND TIME: 0 minutes 16 seconds.  CDE 1.80   SURGEON:  Benay Pillow, MD, MPH  ANESTHESIOLOGIST: Anesthesiologist: Ronelle Nigh, MD CRNA: Silvana Newness, CRNA   ANESTHESIA:  MAC and intracameral preservative-free lidocaine 4%.  ESTIMATED BLOOD LOSS: less than 1 mL.   COMPLICATIONS:  None.   DESCRIPTION OF PROCEDURE:  The patient was identified in the holding room and transported to the operating room.   The patient was placed in the supine position under the operating microscope.  The right eye was prepped and draped in the usual sterile ophthalmic fashion.   A 1.0 millimeter clear-corneal paracentesis was made at the 10:30 position. 0.5 ml of preservative-free 1% lidocaine with epinephrine was injected into the anterior chamber.  The anterior chamber was filled with Healon 5 viscoelastic.  A 2.4 millimeter keratome was used to make a near-clear corneal incision at the 8:00 position.   Attention was turned  to the istent.  The patients head was turned to the left and the microscope was tilted to 035 degrees.  Ocular instruments/Glaukos OAL/H2 gonioprism was used with IPC05 (iclip) coupled with Healon 5 on the cornea was used to visualize the trabecular meshwork. The istent was opened and introduced into the eye.  The meshwork was engaged with the tip of the iStent injector and the stent was deployed into Schlemm's canal at 2:00.  The second stent was attempted to be deployed at 4:00 but the stent was not well seated after both remaining attempts with the injector.  A second istent W injector was unboxed and introduced and deployed without difficulty.  The stents were well seated and in good position. There was heme reflux through both locations.  Next, attention was turned to the phacoemulsification A curvilinear capsulorrhexis was made with a cystotome and capsulorrhexis forceps.  Balanced salt solution was used to hydrodissect and hydrodelineate the nucleus.   Phacoemulsification was then used in stop and chop fashion to remove the lens nucleus and epinucleus.  The remaining cortex was then removed using the irrigation and aspiration handpiece. Healon was then placed into the capsular bag to distend it for lens placement.  A lens was then injected into the capsular bag.  The remaining viscoelastic was aspirated.   Wounds were hydrated with balanced salt solution.  The anterior chamber was inflated to a physiologic pressure with balanced salt solution.   Intracameral vigamox 0.1 mL undiluted was injected into the eye and a drop placed onto the ocular surface.  No wound leaks were noted. The patient  was taken to the recovery room in stable condition without complications of anesthesia or surgery   Benay Pillow 11/28/2020, 11:19 AM

## 2020-11-28 NOTE — Anesthesia Preprocedure Evaluation (Signed)
Anesthesia Evaluation  Patient identified by MRN, date of birth, ID band Patient awake    Reviewed: Allergy & Precautions, H&P , NPO status , Patient's Chart, lab work & pertinent test results  Airway Mallampati: II  TM Distance: >3 FB Neck ROM: full    Dental no notable dental hx.    Pulmonary    Pulmonary exam normal breath sounds clear to auscultation       Cardiovascular hypertension, + dysrhythmias Atrial Fibrillation  Rhythm:regular Rate:Normal     Neuro/Psych    GI/Hepatic   Endo/Other    Renal/GU      Musculoskeletal   Abdominal   Peds  Hematology   Anesthesia Other Findings   Reproductive/Obstetrics                             Anesthesia Physical Anesthesia Plan  ASA: II  Anesthesia Plan: MAC   Post-op Pain Management:    Induction:   PONV Risk Score and Plan: 1 and Treatment may vary due to age or medical condition, Midazolam and TIVA  Airway Management Planned:   Additional Equipment:   Intra-op Plan:   Post-operative Plan:   Informed Consent: I have reviewed the patients History and Physical, chart, labs and discussed the procedure including the risks, benefits and alternatives for the proposed anesthesia with the patient or authorized representative who has indicated his/her understanding and acceptance.     Dental Advisory Given  Plan Discussed with: CRNA  Anesthesia Plan Comments:         Anesthesia Quick Evaluation

## 2022-03-09 ENCOUNTER — Ambulatory Visit: Payer: Medicare PPO | Admitting: Anesthesiology

## 2022-03-09 ENCOUNTER — Ambulatory Visit
Admission: RE | Admit: 2022-03-09 | Discharge: 2022-03-09 | Disposition: A | Discharge status: Home / Self Care | Payer: Medicare PPO | Attending: Gastroenterology | Admitting: Gastroenterology

## 2022-03-09 ENCOUNTER — Encounter: Payer: Self-pay | Admitting: *Deleted

## 2022-03-09 ENCOUNTER — Encounter
Admission: RE | Disposition: A | Discharge status: Home / Self Care | Payer: Self-pay | Source: Home / Self Care | Attending: Gastroenterology

## 2022-03-09 DIAGNOSIS — D123 Benign neoplasm of transverse colon: Secondary | ICD-10-CM | POA: Diagnosis not present

## 2022-03-09 DIAGNOSIS — I4891 Unspecified atrial fibrillation: Secondary | ICD-10-CM | POA: Insufficient documentation

## 2022-03-09 DIAGNOSIS — Z79899 Other long term (current) drug therapy: Secondary | ICD-10-CM | POA: Insufficient documentation

## 2022-03-09 DIAGNOSIS — I10 Essential (primary) hypertension: Secondary | ICD-10-CM | POA: Insufficient documentation

## 2022-03-09 DIAGNOSIS — Z8616 Personal history of COVID-19: Secondary | ICD-10-CM | POA: Diagnosis not present

## 2022-03-09 DIAGNOSIS — K5730 Diverticulosis of large intestine without perforation or abscess without bleeding: Secondary | ICD-10-CM | POA: Diagnosis not present

## 2022-03-09 DIAGNOSIS — Z1211 Encounter for screening for malignant neoplasm of colon: Secondary | ICD-10-CM | POA: Insufficient documentation

## 2022-03-09 DIAGNOSIS — Z6835 Body mass index (BMI) 35.0-35.9, adult: Secondary | ICD-10-CM | POA: Diagnosis not present

## 2022-03-09 DIAGNOSIS — E669 Obesity, unspecified: Secondary | ICD-10-CM | POA: Diagnosis not present

## 2022-03-09 DIAGNOSIS — K635 Polyp of colon: Secondary | ICD-10-CM | POA: Diagnosis not present

## 2022-03-09 DIAGNOSIS — K640 First degree hemorrhoids: Secondary | ICD-10-CM | POA: Insufficient documentation

## 2022-03-09 DIAGNOSIS — G4733 Obstructive sleep apnea (adult) (pediatric): Secondary | ICD-10-CM | POA: Diagnosis not present

## 2022-03-09 HISTORY — PX: COLONOSCOPY WITH PROPOFOL: SHX5780

## 2022-03-09 SURGERY — COLONOSCOPY WITH PROPOFOL
Anesthesia: General

## 2022-03-09 MED ORDER — PROPOFOL 10 MG/ML IV BOLUS
INTRAVENOUS | Status: DC | PRN
  Administered 2022-03-09 (×2): 50 mg via INTRAVENOUS

## 2022-03-09 MED ORDER — STERILE WATER FOR IRRIGATION IR SOLN
Status: DC | PRN
  Administered 2022-03-09 (×2): 60 mL

## 2022-03-09 MED ORDER — SODIUM CHLORIDE 0.9 % IV SOLN: INTRAVENOUS | Status: DC | PRN

## 2022-03-09 MED ORDER — PROPOFOL 500 MG/50ML IV EMUL
INTRAVENOUS | Status: AC
  Filled 2022-03-09: qty 50

## 2022-03-09 MED ORDER — PROPOFOL 500 MG/50ML IV EMUL
INTRAVENOUS | Status: DC | PRN
  Administered 2022-03-09: 100 ug/kg/min via INTRAVENOUS

## 2022-03-09 MED ORDER — SODIUM CHLORIDE 0.9 % IV SOLN: INTRAVENOUS | Status: DC

## 2022-03-09 NOTE — Op Note (Signed)
[facility] ?Gastroenterology ?Patient Name: Cody Golden ?Procedure Date: 03/09/2022 11:21 AM ?MRN: [id number] ?Account #: [id number] ?Date of Birth: July 06, 1956 ?Admit Type: Outpatient ?Age: 66 ?Room: [facility] ENDO ROOM 3 ?Gender: Male ?Note Status: Finalized ?Instrument Name: Colonscope [id number] ?Procedure:             Colonoscopy ?Indications:           Screening for colorectal malignant neoplasm ?Providers:             Andrey Farmer MD, MD ?Medicines:             Monitored Anesthesia Care ?Complications:         No immediate complications. Estimated blood loss:  ?                       Minimal. ?Procedure:             Pre-Anesthesia Assessment: ?                       - Prior to the procedure, a History and Physical was  ?                       performed, and patient medications and allergies were  ?                       reviewed. The patient is competent. The risks and  ?                       benefits of the procedure and the sedation options and  ?                       risks were discussed with the patient. All questions  ?                       were answered and informed consent was obtained.  ?                       Patient identification and proposed procedure were  ?                       verified by the physician, the nurse, the  ?                       anesthesiologist, the anesthetist and the technician  ?                       in the endoscopy suite. Mental Status Examination:  ?                       alert and oriented. Airway Examination: normal  ?                       oropharyngeal airway and neck mobility. Respiratory  ?                       Examination: clear to auscultation. CV Examination:  ?                       normal. Prophylactic Antibiotics: The patient does not  ?  require prophylactic antibiotics. Prior  ?                       Anticoagulants: The patient has taken no previous  ?                       anticoagulant or antiplatelet agents except for   ?                       aspirin. ASA Grade Assessment: II - A patient with  ?                       mild systemic disease. After reviewing the risks and  ?                       benefits, the patient was deemed in satisfactory  ?                       condition to undergo the procedure. The anesthesia  ?                       plan was to use monitored anesthesia care (MAC).  ?                       Immediately prior to administration of medications,  ?                       the patient was re-assessed for adequacy to receive  ?                       sedatives. The heart rate, respiratory rate, oxygen  ?                       saturations, blood pressure, adequacy of pulmonary  ?                       ventilation, and response to care were monitored  ?                       throughout the procedure. The physical status of the  ?                       patient was re-assessed after the procedure. ?                       After obtaining informed consent, the colonoscope was  ?                       passed under direct vision. Throughout the procedure,  ?                       the patient's blood pressure, pulse, and oxygen  ?                       saturations were monitored continuously. The  ?                       Colonoscope was introduced through the anus and  ?  advanced to the the cecum, identified by appendiceal  ?                       orifice and ileocecal valve. The colonoscopy was  ?                       performed without difficulty. The patient tolerated  ?                       the procedure well. The quality of the bowel  ?                       preparation was good. ?Findings: ?     The perianal and digital rectal examinations were normal. ?     Multiple small-mouthed diverticula were found in the sigmoid colon,  ?     descending colon, transverse colon and ascending colon. ?     A 1 mm polyp was found in the transverse colon. The polyp was sessile.  ?     The polyp was removed with  a jumbo cold forceps. Resection and retrieval  ?     were complete. Estimated blood loss was minimal. ?     A less than 1 mm polyp was found in the sigmoid colon. The polyp was  ?     sessile. The polyp was removed with a jumbo cold forceps. Resection and  ?     retrieval were complete. Estimated blood loss was minimal. ?     Internal hemorrhoids were found during retroflexion. The hemorrhoids  ?     were Grade I (internal hemorrhoids that do not prolapse). ?     The exam was otherwise without abnormality on direct and retroflexion  ?     views. ?Impression:            - Diverticulosis in the sigmoid colon, in the  ?                       descending colon, in the transverse colon and in the  ?                       ascending colon. ?                       - One 1 mm polyp in the transverse colon, removed with  ?                       a jumbo cold forceps. Resected and retrieved. ?                       - One less than 1 mm polyp in the sigmoid colon,  ?                       removed with a jumbo cold forceps. Resected and  ?                       retrieved. ?                       - Internal hemorrhoids. ?                       -  The examination was otherwise normal on direct and  ?                       retroflexion views. ?Recommendation:        - Discharge patient to home. ?                       - Resume previous diet. ?                       - Continue present medications. ?                       - Await pathology results. ?                       - Repeat colonoscopy in 7 years for surveillance. ?                       - Return to referring physician as previously  ?                       scheduled. ?Procedure Code(s):     --- Professional --- ?                       [phone], Colonoscopy, flexible; with biopsy, single or  ?                       multiple ?Diagnosis Code(s):     --- Professional --- ?                       Z12.11, Encounter for screening for malignant neoplasm  ?                       of colon ?                        K64.0, First degree hemorrhoids ?                       K63.5, Polyp of colon ?                       K57.30, Diverticulosis of large intestine without  ?                       perforation or abscess without bleeding ?CPT copyright 2019 [employer] Association. All rights reserved. ?The codes documented in this report are preliminary and upon coder review may  ?be revised to meet current compliance requirements. ?Andrey Farmer MD, MD ?03/09/2022 11:43:22 AM ?Number of Addenda: 0 ?Note Initiated On: 03/09/2022 11:21 AM ?Scope Withdrawal Time: 0 hours 10 minutes 57 seconds  ?Total Procedure Duration: 0 hours 14 minutes 17 seconds  ?Estimated Blood Loss:  Estimated blood loss was minimal. ?     [facility] ?

## 2022-03-09 NOTE — Interval H&P Note (Signed)
History and Physical Interval Note: ? ?03/09/2022 ?11:18 AM ? ?Cody Golden  has presented today for surgery, with the diagnosis of Screening Colonoscopy.  The various methods of treatment have been discussed with the patient and family. After consideration of risks, benefits and other options for treatment, the patient has consented to  Procedure(s): ?COLONOSCOPY WITH PROPOFOL (N/A) as a surgical intervention.  The patient's history has been reviewed, patient examined, no change in status, stable for surgery.  I have reviewed the patient's chart and labs.  Questions were answered to the patient's satisfaction.   ? ? ?Hilton Cork Josephus Harriger ? ?Ok to proceed with colonoscopy ?

## 2022-03-09 NOTE — Anesthesia Postprocedure Evaluation (Signed)
Anesthesia Post Note ? ?Patient: Cody Golden ? ?Procedure(s) Performed: COLONOSCOPY WITH PROPOFOL ? ?Patient location during evaluation: PACU ?Anesthesia Type: General ?Level of consciousness: awake and alert, oriented and patient cooperative ?Pain management: pain level controlled ?Vital Signs Assessment: post-procedure vital signs reviewed and stable ?Respiratory status: spontaneous breathing, nonlabored ventilation and respiratory function stable ?Cardiovascular status: blood pressure returned to baseline and stable ?Postop Assessment: adequate PO intake ?Anesthetic complications: no ? ? ?No notable events documented. ? ? ?Last Vitals:  ?Vitals:  ? 03/09/22 1056 03/09/22 1145  ?BP:  113/80  ?Pulse: 78 84  ?Resp: 18 12  ?Temp: (!) 35.8 ?C   ?SpO2: 99% 94%  ?  ?Last Pain:  ?Vitals:  ? 03/09/22 1205  ?TempSrc:   ?PainSc: 0-No pain  ? ? ?  ?  ?  ?  ?  ?  ? ?Darrin Nipper ? ? ? ? ?

## 2022-03-09 NOTE — Transfer of Care (Signed)
Immediate Anesthesia Transfer of Care Note ? ?Patient: Cody Golden ? ?Procedure(s) Performed: COLONOSCOPY WITH PROPOFOL ? ?Patient Location: PACU ? ?Anesthesia Type:MAC ? ?Level of Consciousness: alert , oriented and drowsy ? ?Airway & Oxygen Therapy: Patient Spontanous Breathing ? ?Post-op Assessment: Report given to RN and Post -op Vital signs reviewed and stable ? ?Post vital signs: Reviewed and stable ? ?Last Vitals:  ?Vitals Value Taken Time  ?BP 113/80 03/09/22 1144  ?Temp    ?Pulse 84 03/09/22 1144  ?Resp 12 03/09/22 1144  ?SpO2 94 % 03/09/22 1144  ?Vitals shown include unvalidated device data. ? ?Last Pain:  ?Vitals:  ? 03/09/22 1135  ?TempSrc:   ?PainSc: 0-No pain  ?   ? ?  ? ?Complications: No notable events documented. ?

## 2022-03-09 NOTE — Anesthesia Preprocedure Evaluation (Addendum)
Anesthesia Evaluation  ?Patient identified by MRN, date of birth, ID band ?Patient awake ? ? ? ?Reviewed: ?Allergy & Precautions, NPO status , Patient's Chart, lab work & pertinent test results ? ?History of Anesthesia Complications ?Negative for: history of anesthetic complications ? ?Airway ?Mallampati: I ? ? ?Neck ROM: Full ? ? ? Dental ? ?(+) Missing, Chipped ?  ?Pulmonary ?sleep apnea ,  ?  ?Pulmonary exam normal ?breath sounds clear to auscultation ? ? ? ? ? ? Cardiovascular ?hypertension, + dysrhythmias (a fib)  ?Rhythm:Irregular Rate:Normal ? ?ECG 11/30/21: nonspecific ST and T waves changes, atrial fibrillation, rate 89 ?  ?Neuro/Psych ?negative neurological ROS ?   ? GI/Hepatic ?negative GI ROS,   ?Endo/Other  ?Obesity  ? Renal/GU ?negative Renal ROS  ? ?  ?Musculoskeletal ? ?(+) Arthritis ,  ? Abdominal ?  ?Peds ? Hematology ?negative hematology ROS ?(+)   ?Anesthesia Other Findings ?Cardiology note 11/30/21: ?1. Atrial fibrillation, unspecified type (CMS-HCC)-atrial fibrillation rate is well controlled. Score is 1 for age of 48 and 1 for hypertension. He continues off anticoagulation. 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 Remain on aspirin and rediscuss chronic anticoagulation at next visit.. No symptoms that would preclude commercial license. I48.91 427.31 ECG 12-lead  ?2. Obstructive sleep apnea syndrome-not currently a problem. Has lost weight and has no further sleep apnea episodes. G47.33 327.23  ? ?Return in about 6 months (around 05/30/2022). ? ? Reproductive/Obstetrics ? ?  ? ? ? ? ? ? ? ? ? ? ? ? ? ?  ?  ? ? ? ? ? ? ? ?Anesthesia Physical ?Anesthesia Plan ? ?ASA: 3 ? ?Anesthesia Plan: General  ? ?Post-op Pain Management:   ? ?Induction: Intravenous ? ?PONV Risk Score and Plan: 2 and Propofol infusion, TIVA and Treatment may vary due to age or medical condition ? ?Airway Management Planned: Natural Airway ? ?Additional  Equipment:  ? ?Intra-op Plan:  ? ?Post-operative Plan:  ? ?Informed Consent: I have reviewed the patients History and Physical, chart, labs and discussed the procedure including the risks, benefits and alternatives for the proposed anesthesia with the patient or authorized representative who has indicated his/her understanding and acceptance.  ? ? ? ? ? ?Plan Discussed with: CRNA ? ?Anesthesia Plan Comments: (LMA/GETA backup discussed.  Patient consented for risks of anesthesia including but not limited to:  ?- adverse reactions to medications ?- damage to eyes, teeth, lips or other oral mucosa ?- nerve damage due to positioning  ?- sore throat or hoarseness ?- damage to heart, brain, nerves, lungs, other parts of body or loss of life ? ?Informed patient about role of CRNA in peri- and intra-operative care.  Patient voiced understanding.)  ? ? ? ? ? ?Anesthesia Quick Evaluation ? ?

## 2022-03-09 NOTE — H&P (Signed)
Outpatient short stay form Pre-procedure ?03/09/2022  ?Lesly Rubenstein, MD ? ?Primary Physician: Derinda Late, MD ? ?Reason for visit:  Screening colonoscopy ? ?History of present illness:   ? ?66 y/o gentleman with history of hypertension and a. Fib (only on aspirin) here for screening colonoscopy. Last colonoscopy > 10 years ago. No blood thinners. No family history of GI malignancies. No significant abdominal surgeries. ? ? ? ?Current Facility-Administered Medications:  ?  0.9 %  sodium chloride infusion, , Intravenous, Continuous, Byrne Capek, Hilton Cork, MD, Last Rate: 20 mL/hr at 03/09/22 1111, New Bag at 03/09/22 1111 ? ?Medications Prior to Admission  ?Medication Sig Dispense Refill Last Dose  ? latanoprost (XALATAN) 0.005 % ophthalmic solution Place 1 drop into both eyes at bedtime.   03/09/2022  ? timolol (BETIMOL) 0.5 % ophthalmic solution Place 1 drop into both eyes daily.   03/09/2022  ? aspirin 81 MG chewable tablet Chew by mouth daily.   03/07/2022  ? co-enzyme Q-10 30 MG capsule Take 30 mg by mouth daily.   03/07/2022  ? losartan (COZAAR) 25 MG tablet Take 25 mg by mouth daily as needed.   03/07/2022  ? Multiple Vitamin (MULTIVITAMIN) tablet Take 1 tablet by mouth daily.   03/07/2022  ? ? ? ?No Known Allergies ? ? ?Past Medical History:  ?Diagnosis Date  ? Arthritis   ? Atrial fibrillation ([place])   ? chronic  ? COVID-19 11/02/2019  ? Dysrhythmia   ? A-fib  ? ? ?Review of systems:  Otherwise negative.  ? ? ?Physical Exam ? ?Gen: Alert, oriented. Appears stated age.  ?HEENT: PERRLA. ?Lungs: No respiratory distress ?CV: RRR ?Abd: soft, benign, no masses ?Ext: No edema ? ? ? ?Planned procedures: Proceed with colonoscopy. The patient understands the nature of the planned procedure, indications, risks, alternatives and potential complications including but not limited to bleeding, infection, perforation, damage to internal organs and possible oversedation/side effects from anesthesia. The patient agrees and  gives consent to proceed.  ?Please refer to procedure notes for findings, recommendations and patient disposition/instructions.  ? ? ? ?Lesly Rubenstein, MD ?[facility] ? ? ? ?  ?

## 2022-03-12 LAB — SURGICAL PATHOLOGY

## 2022-07-05 ENCOUNTER — Emergency Department: Payer: Medicare PPO

## 2022-07-05 ENCOUNTER — Observation Stay: Payer: Medicare PPO

## 2022-07-05 ENCOUNTER — Observation Stay
Admission: EM | Admit: 2022-07-05 | Discharge: 2022-07-06 | Disposition: A | Discharge status: Home / Self Care | Payer: Medicare PPO | Attending: Internal Medicine | Admitting: Internal Medicine

## 2022-07-05 ENCOUNTER — Other Ambulatory Visit: Payer: Self-pay

## 2022-07-05 ENCOUNTER — Encounter: Payer: Self-pay | Admitting: Emergency Medicine

## 2022-07-05 DIAGNOSIS — I10 Essential (primary) hypertension: Secondary | ICD-10-CM

## 2022-07-05 DIAGNOSIS — R109 Unspecified abdominal pain: Secondary | ICD-10-CM | POA: Diagnosis present

## 2022-07-05 DIAGNOSIS — Z9689 Presence of other specified functional implants: Secondary | ICD-10-CM | POA: Insufficient documentation

## 2022-07-05 DIAGNOSIS — E669 Obesity, unspecified: Secondary | ICD-10-CM | POA: Diagnosis not present

## 2022-07-05 DIAGNOSIS — I712 Thoracic aortic aneurysm, without rupture, unspecified: Secondary | ICD-10-CM | POA: Diagnosis not present

## 2022-07-05 DIAGNOSIS — Z6835 Body mass index (BMI) 35.0-35.9, adult: Secondary | ICD-10-CM | POA: Insufficient documentation

## 2022-07-05 DIAGNOSIS — Z79899 Other long term (current) drug therapy: Secondary | ICD-10-CM | POA: Insufficient documentation

## 2022-07-05 DIAGNOSIS — I5A Non-ischemic myocardial injury (non-traumatic): Secondary | ICD-10-CM | POA: Insufficient documentation

## 2022-07-05 DIAGNOSIS — Z8616 Personal history of COVID-19: Secondary | ICD-10-CM | POA: Insufficient documentation

## 2022-07-05 DIAGNOSIS — G4733 Obstructive sleep apnea (adult) (pediatric): Secondary | ICD-10-CM | POA: Diagnosis not present

## 2022-07-05 DIAGNOSIS — R7989 Other specified abnormal findings of blood chemistry: Secondary | ICD-10-CM | POA: Diagnosis present

## 2022-07-05 DIAGNOSIS — Z5309 Procedure and treatment not carried out because of other contraindication: Secondary | ICD-10-CM

## 2022-07-05 DIAGNOSIS — Z7982 Long term (current) use of aspirin: Secondary | ICD-10-CM | POA: Insufficient documentation

## 2022-07-05 DIAGNOSIS — I482 Chronic atrial fibrillation, unspecified: Secondary | ICD-10-CM | POA: Insufficient documentation

## 2022-07-05 DIAGNOSIS — R778 Other specified abnormalities of plasma proteins: Secondary | ICD-10-CM | POA: Insufficient documentation

## 2022-07-05 DIAGNOSIS — R55 Syncope and collapse: Secondary | ICD-10-CM | POA: Diagnosis not present

## 2022-07-05 DIAGNOSIS — E66812 Obesity, class 2: Secondary | ICD-10-CM | POA: Diagnosis present

## 2022-07-05 DIAGNOSIS — R079 Chest pain, unspecified: Secondary | ICD-10-CM | POA: Insufficient documentation

## 2022-07-05 DIAGNOSIS — I4891 Unspecified atrial fibrillation: Secondary | ICD-10-CM | POA: Diagnosis present

## 2022-07-05 LAB — URINALYSIS, ROUTINE W REFLEX MICROSCOPIC
Bacteria, UA: NONE SEEN
Bilirubin Urine: NEGATIVE
Glucose, UA: NEGATIVE mg/dL
Hgb urine dipstick: NEGATIVE
Ketones, ur: NEGATIVE mg/dL
Leukocytes,Ua: NEGATIVE
Nitrite: NEGATIVE
Protein, ur: 30 mg/dL — AB
Specific Gravity, Urine: 1.027 (ref 1.005–1.030)
Squamous Epithelial / HPF: NONE SEEN (ref 0–5)
pH: 5 (ref 5.0–8.0)

## 2022-07-05 LAB — HEPATIC FUNCTION PANEL
ALT: 14 U/L (ref 0–44)
AST: 21 U/L (ref 15–41)
Albumin: 4 g/dL (ref 3.5–5.0)
Alkaline Phosphatase: 69 U/L (ref 38–126)
Bilirubin, Direct: <0.1 mg/dL (ref 0.0–0.2)
Total Bilirubin: 0.7 mg/dL (ref 0.3–1.2)
Total Protein: 7.6 g/dL (ref 6.5–8.1)

## 2022-07-05 LAB — BASIC METABOLIC PANEL
Anion gap: 8 (ref 5–15)
BUN: 27 mg/dL — ABNORMAL HIGH (ref 8–23)
CO2: 23 mmol/L (ref 22–32)
Calcium: 9.1 mg/dL (ref 8.9–10.3)
Chloride: 107 mmol/L (ref 98–111)
Creatinine, Ser: 1.24 mg/dL (ref 0.61–1.24)
GFR, Estimated: >60 mL/min (ref >60)
Glucose, Bld: 112 mg/dL — ABNORMAL HIGH (ref 70–99)
Potassium: 4 mmol/L (ref 3.5–5.1)
Sodium: 138 mmol/L (ref 135–145)

## 2022-07-05 LAB — CBC
HCT: 38.4 % — ABNORMAL LOW (ref 39.0–52.0)
Hemoglobin: 11.7 g/dL — ABNORMAL LOW (ref 13.0–17.0)
MCH: 24.3 pg — ABNORMAL LOW (ref 26.0–34.0)
MCHC: 30.5 g/dL (ref 30.0–36.0)
MCV: 79.7 fL — ABNORMAL LOW (ref 80.0–100.0)
Platelets: 342 10*3/uL (ref 150–400)
RBC: 4.82 MIL/uL (ref 4.22–5.81)
RDW: 16 % — ABNORMAL HIGH (ref 11.5–15.5)
WBC: 9.7 10*3/uL (ref 4.0–10.5)
nRBC: 0 % (ref 0.0–0.2)

## 2022-07-05 LAB — BRAIN NATRIURETIC PEPTIDE: B Natriuretic Peptide: 74.4 pg/mL (ref 0.0–100.0)

## 2022-07-05 LAB — LIPASE, BLOOD: Lipase: 38 U/L (ref 11–51)

## 2022-07-05 LAB — PROCALCITONIN: Procalcitonin: <0.1 ng/mL

## 2022-07-05 LAB — TROPONIN I (HIGH SENSITIVITY)
Troponin I (High Sensitivity): 117 ng/L (ref <18)
Troponin I (High Sensitivity): 100 ng/L (ref <18)

## 2022-07-05 MED ORDER — SENNOSIDES-DOCUSATE SODIUM 8.6-50 MG PO TABS: 1.0000 | ORAL_TABLET | Freq: Every evening | ORAL | Status: DC | PRN

## 2022-07-05 MED ORDER — MORPHINE SULFATE (PF) 4 MG/ML IV SOLN: 4.0000 mg | INTRAVENOUS | Status: DC | PRN

## 2022-07-05 MED ORDER — LIDOCAINE 5 % EX PTCH
1.0000 | MEDICATED_PATCH | CUTANEOUS | Status: DC
  Administered 2022-07-05: 1 via TRANSDERMAL
  Filled 2022-07-05: qty 1

## 2022-07-05 MED ORDER — ONDANSETRON HCL 4 MG/2ML IJ SOLN
4.0000 mg | Freq: Four times a day (QID) | INTRAMUSCULAR | Status: DC | PRN
Start: 2022-07-05 — End: 2022-07-06

## 2022-07-05 MED ORDER — HEPARIN SODIUM (PORCINE) 5000 UNIT/ML IJ SOLN
5000.0000 [IU] | Freq: Three times a day (TID) | INTRAMUSCULAR | Status: DC
  Administered 2022-07-05 – 2022-07-06 (×2): 5000 [IU] via SUBCUTANEOUS
  Filled 2022-07-05 (×2): qty 1

## 2022-07-05 MED ORDER — SODIUM CHLORIDE 0.9 % IV BOLUS
1000.0000 mL | Freq: Once | INTRAVENOUS | Status: AC
  Administered 2022-07-05: 1000 mL via INTRAVENOUS

## 2022-07-05 MED ORDER — MORPHINE SULFATE (PF) 2 MG/ML IV SOLN: 2.0000 mg | INTRAVENOUS | Status: DC | PRN

## 2022-07-05 MED ORDER — ASPIRIN 81 MG PO CHEW
81.0000 mg | CHEWABLE_TABLET | Freq: Every day | ORAL | Status: DC
  Administered 2022-07-06: 81 mg via ORAL
  Filled 2022-07-05: qty 1

## 2022-07-05 MED ORDER — OXYCODONE HCL 5 MG PO TABS
5.0000 mg | ORAL_TABLET | Freq: Once | ORAL | Status: AC
  Administered 2022-07-05: 5 mg via ORAL
  Filled 2022-07-05: qty 1

## 2022-07-05 MED ORDER — HYDRALAZINE HCL 20 MG/ML IJ SOLN: 5.0000 mg | Freq: Three times a day (TID) | INTRAMUSCULAR | Status: DC | PRN

## 2022-07-05 MED ORDER — NITROGLYCERIN 2 % TD OINT: 1.0000 [in_us] | TOPICAL_OINTMENT | Freq: Four times a day (QID) | TRANSDERMAL | Status: DC | PRN

## 2022-07-05 MED ORDER — ACETAMINOPHEN 500 MG PO TABS
1000.0000 mg | ORAL_TABLET | Freq: Once | ORAL | Status: AC
Start: 2022-07-05 — End: 2022-07-05
  Administered 2022-07-05: 1000 mg via ORAL
  Filled 2022-07-05: qty 2

## 2022-07-05 MED ORDER — ASPIRIN 81 MG PO CHEW
324.0000 mg | CHEWABLE_TABLET | Freq: Once | ORAL | Status: AC
  Administered 2022-07-05: 324 mg via ORAL
  Filled 2022-07-05: qty 4

## 2022-07-05 MED ORDER — LATANOPROST 0.005 % OP SOLN
1.0000 [drp] | Freq: Every day | OPHTHALMIC | Status: DC
Start: 2022-07-05 — End: 2022-07-06
  Administered 2022-07-05: 1 [drp] via OPHTHALMIC
  Filled 2022-07-05: qty 2.5

## 2022-07-05 MED ORDER — TIMOLOL MALEATE 0.5 % OP SOLN
1.0000 [drp] | Freq: Every morning | OPHTHALMIC | Status: DC
  Administered 2022-07-06: 1 [drp] via OPHTHALMIC
  Filled 2022-07-05: qty 5

## 2022-07-05 MED ORDER — IOHEXOL 350 MG/ML SOLN
100.0000 mL | Freq: Once | INTRAVENOUS | Status: AC | PRN
  Administered 2022-07-05: 100 mL via INTRAVENOUS

## 2022-07-05 MED ORDER — IOHEXOL 350 MG/ML SOLN
75.0000 mL | Freq: Once | INTRAVENOUS | Status: AC | PRN
  Administered 2022-07-05: 75 mL via INTRAVENOUS

## 2022-07-05 MED ORDER — ACETAMINOPHEN 650 MG RE SUPP: 650.0000 mg | Freq: Four times a day (QID) | RECTAL | Status: DC | PRN

## 2022-07-05 MED ORDER — ONDANSETRON HCL 4 MG PO TABS: 4.0000 mg | ORAL_TABLET | Freq: Four times a day (QID) | ORAL | Status: DC | PRN

## 2022-07-05 MED ORDER — TIZANIDINE HCL 2 MG PO TABS
4.0000 mg | ORAL_TABLET | Freq: Three times a day (TID) | ORAL | Status: DC | PRN
Start: 2022-07-05 — End: 2022-07-06
  Administered 2022-07-05 – 2022-07-06 (×2): 4 mg via ORAL
  Filled 2022-07-05 (×2): qty 2

## 2022-07-05 MED ORDER — LOSARTAN POTASSIUM 50 MG PO TABS
50.0000 mg | ORAL_TABLET | Freq: Every day | ORAL | Status: DC
  Administered 2022-07-06: 50 mg via ORAL
  Filled 2022-07-05: qty 1

## 2022-07-05 MED ORDER — ACETAMINOPHEN 325 MG PO TABS: 650.0000 mg | ORAL_TABLET | Freq: Four times a day (QID) | ORAL | Status: DC | PRN

## 2022-07-05 NOTE — ED Notes (Signed)
Assisted to bathroom to void.. back to bed.  Pt ambulates steadily.

## 2022-07-05 NOTE — Hospital Course (Addendum)
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 Patient moved some heavy furniture on Wednesday, he sustained significant pain in his bilateral shoulders, he went to the orthopedics, was diagnosed with a tear in his quadricep muscle.  Since then, the pain has been persistent and worse with movement.  He went back to the orthopedist office yesterday as pain was not resolving.  While in the office, patient had an episode of severe pain, then he became diaphoretic.  After that, he became very dizzy.  By time to come to the hospital, he was found to have a troponin at 117, EKG showed incomplete RBBB, flattened T waves.  No ST elevation.  Patient condition appears to be caused by the following: Muscle injury caused pain, pain caused diaphoresis, then blood pressure dropped due to diaphoresis, which explains the dizziness, further caused demand ischemia from transient hypotension.  Patient is seen by cardiology today, cleared for discharge.  Patient currently is medically stable to be discharged.  He will follow-up with cardiology for future work-up.

## 2022-07-05 NOTE — Assessment & Plan Note (Signed)
-   Losartan 50 mg daily resumed - Hydralazine 5 mg IV every 8 hours as needed for SBP greater than 180, 4 days ordered

## 2022-07-05 NOTE — Assessment & Plan Note (Addendum)
Bilateral (Right > Left) flank pain with radiation to shoulder - Negative with percussive tenderness at the CV angle and negative for palpation pain - Patient is status post acetaminophen and oxycodone 5 mg p.o. per EDP - Nitroglycerin ointment, 1 inch topical every 6 hours as needed for chest pain - Lidocaine patch daily ordered - Morphine 2 mg IV every 4 hours as needed for severe pain, 4 doses ordered - Aspirin 81 mg resumed daily
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
## 2022-07-05 NOTE — Assessment & Plan Note (Signed)
-   This meets criteria for morbid obesity based on the presence of 1 or more chronic comorbidities. Patient has hypertension and obstructive sleep apnea. This complicates overall care and prognosis.

## 2022-07-05 NOTE — Assessment & Plan Note (Deleted)
Right-sided flank pain with radiation to shoulder - Patient is status post - Nitroglycerin ointment, 1 inch topical every 6 hours as needed for chest pain - Morphine 4 mg IV every 4 hours as needed for moderate and severe pain, 4 doses ordered - Aspirin 81 mg resumed daily

## 2022-07-05 NOTE — ED Triage Notes (Signed)
First Nurse: Pt here from [facility] c/o flank pain. Pt got diaphoretic at the clinic so he was advised to come to the ED for evaluation of a kidney stone.

## 2022-07-05 NOTE — H&P (Addendum)
History and Physical   Cody Golden NFA:[id number] DOB: Apr 16, 1956 DOA: 07/05/2022  PCP: Derinda Late, MD  Outpatient Specialists: Dr. Ubaldo Glassing [facility] Cardiology Patient coming from: [facility]  I have personally briefly reviewed patient's old medical records in [place].  Chief Concern: Near syncope, diaphoresis  HPI: 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  Patient was being evaluated at [facility] for chief concerns of right flank pain with radiation to shoulder blade when he suddenly felt diaphoretic with near syncopal episode.  Patient was sent to the emergency department for further evaluation.  Initial vitals in the emergency department showed temperature 98.1, respiration rate of 18, heart rate of 75, blood pressure 106/78, SPO2 of 99% on room air.  Serum sodium is 138, potassium 4.0, chloride 107, bicarb 23, BUN of 27, serum creatinine of 1.24, nonfasting blood glucose 112, GFR of greater than 60, WBC 9.7, hemoglobin 11.7, platelets of 342.  High sensitive troponin was 117 and decreased to 100.  UA was negative for nitrates and leukocytes. LFT ordered in the ED was within normal limits.  ED treatment: Acetaminophen 1 g, aspirin 324 mg, oxycodone 5 mg p.o., sodium chloride 1 L bolus.  At bedside, he is able to tell me his name, current calendar year, identify his wife, Katharine Look at bedside, and his current location.   He reports the flank pain is 10 out of 10 and denies trauma to his person.  He reports he has never experienced pain like this before.   He reports flank pain (R > L) that started last Thursday, 06/28/22. He presented to [facility] [place] and was prescribed steroids and muscle relaxer.   Today, the pain was worse prompting another visit at [facility]. In the urgent care center, he developed dizziness and diaphoresis. His  wife reports he blanked for a moment. Patient did not recall those moments. His wife reports a 5-10 seconds lapse, and this happened twice.  He denies shortness of breath, chest pain, abdominal pain, nausea, vomiting, chills, fever, cough.  Social history: He lives with his wife. He denies tobacco, etoh, and recreational drug use. He drives tractor/trailer.   Family history: Patient is adopted.  He does know his maternal side of the family.  There is limited knowledge from his paternal history. His maternal uncle had cancers.  His paternal grandfather died at an early age for unknown reasons.  He reports he met his natural father who did not have heart disease.  ROS: Constitutional: no weight change, no fever ENT/Mouth: no sore throat, no rhinorrhea Eyes: no eye pain, no vision changes Cardiovascular: no chest pain, no dyspnea,  no edema, no palpitations Respiratory: no cough, no sputum, no wheezing Gastrointestinal: no nausea, no vomiting, no diarrhea, no constipation Genitourinary: no urinary incontinence, no dysuria, no hematuria Musculoskeletal: no arthralgias, no myalgias, bilateral flank pain R > L Skin: no skin lesions, no pruritus, Neuro: + weakness, no loss of consciousness, + syncope Psych: no anxiety, no depression, + decrease appetite Heme/Lymph: no bruising, no bleeding  ED Course: Discussed with emergency medicine provider, patient requiring hospitalization for chief concerns of elevated troponin with near syncope symptoms.  Assessment/Plan  Principal Problem:   Near syncope Active Problems:   Essential hypertension   OSA (obstructive sleep apnea)   Atrial fibrillation (HCC)   Elevated troponin   MRI contraindicated due to metal implant   Flank pain  Obesity, Class II, BMI 35-39.9  Assessment and Plan:  * Near syncope - Query infectious etiology (pneumonia) versus heart failure - Check procalcitonin and BNP -Complete echo ordered - If echo is negative would  recommend a.m. team discharge patient with Holter monitor for follow-up with his outpatient cardiologist - Fall precautions  Obesity, Class II, BMI 35-39.9 - This meets criteria for morbid obesity based on the presence of 1 or more chronic comorbidities. Patient has hypertension and obstructive sleep apnea. This complicates overall care and prognosis.   Flank pain Bilateral (Right > Left) flank pain with radiation to shoulder - Negative with percussive tenderness at the CV angle and negative for palpation pain - Patient is status post acetaminophen and oxycodone 5 mg p.o. per EDP - Nitroglycerin ointment, 1 inch topical every 6 hours as needed for chest pain - Lidocaine patch daily ordered - Morphine 2 mg IV every 4 hours as needed for severe pain, 4 doses ordered - Aspirin 81 mg resumed daily  MRI contraindicated due to metal implant - Patient is status post metal stent placement 1 to 2 years ago - Patient was told that he would not be a candidate for MRI of the brain  Elevated troponin - Etiology work-up in progress at this time - Differentials include demand ischemia secondary to new heart failure versus pneumonia - Check BNP, procalcitonin - BNP was negative however given obesity, this may be of false negative - Complete echo ordered  Atrial fibrillation ([place]) - Patient was diagnosed with atrial fibrillation approximately 18 months ago - Patient is currently not on anticoagulation per cardiology note on 11/30/2021  Essential hypertension - Losartan 50 mg daily resumed - Hydralazine 5 mg IV every 8 hours as needed for SBP greater than 180, 4 days ordered  Chart reviewed.   Colonoscopy on 03/09/2022: Multiple small mouth diverticula in the sigmoid colon, descending colon, transverse colon and ascending colon.  1 mm polyp in the transverse colon that was described as sessile.  The polyp was status post removal with jumbo cold forceps.  Resection and retrieval were complete.  Less  than 1 mm polyp found in the sigmoid colon described as sessile and status post removal with jumbo cold forceps.  Internal hemorrhoids found during retroflexion.  Grade 1 (no prolapse).  DVT prophylaxis: Heparin 5000 units subcutaneous every 8 hours Code Status: Full code Diet: Heart healthy Family Communication: updated spouse at bedside Disposition Plan: Pending clinical course Consults called: None at this time Admission status: Telemetry cardiac, observation  Past Medical History:  Diagnosis Date   Arthritis    Atrial fibrillation ([place])    chronic   COVID-19 11/02/2019   Dysrhythmia    A-fib   Past Surgical History:  Procedure Laterality Date   CARDIOVERSION     x3 done years ago without conversion   CATARACT EXTRACTION W/PHACO Right 11/28/2020   Procedure: CATARACT EXTRACTION PHACO AND INTRAOCULAR LENS PLACEMENT ([place]) RIGHT ISTENT INJ 1.80 00:16.1;  Surgeon: Eulogio Bear, MD;  Location: [place];  Service: Ophthalmology;  Laterality: Right;  covid + 11-16-20   COLONOSCOPY WITH PROPOFOL N/A 03/09/2022   Procedure: COLONOSCOPY WITH PROPOFOL;  Surgeon: Lesly Rubenstein, MD;  Location: [facility];  Service: Endoscopy;  Laterality: N/A;   SHOULDER ARTHROSCOPY     WEIL OSTEOTOMY Right 03/24/2015   Procedure: 2ND -3RD METARSAL WEIL OSTEOTOMY,  PLANTAR PLATE REPAIR ;  Surgeon: Wylene Simmer, MD;  Location: [place];  Service: Orthopedics;  Laterality: Right;  Social History:  reports that he has never smoked. He has never used smokeless tobacco. He reports that he does not drink alcohol and does not use drugs.  No Known Allergies Family History  Adopted: Yes   Family history: Family history reviewed and not pertinent  Prior to Admission medications   Medication Sig Start Date End Date Taking? Authorizing Provider  aspirin 81 MG chewable tablet Chew 81 mg by mouth daily.   Yes [provider]  co-enzyme Q-10 30 MG capsule Take 30 mg  by mouth daily.   Yes [provider]  latanoprost (XALATAN) 0.005 % ophthalmic solution Place 1 drop into both eyes at bedtime.   Yes [provider]  losartan (COZAAR) 50 MG tablet Take 50 mg by mouth daily. 06/01/22  Yes [provider]  Multiple Vitamin (MULTIVITAMIN) tablet Take 1 tablet by mouth daily.   Yes [provider]  timolol (TIMOPTIC) 0.5 % ophthalmic solution Place 1 drop into both eyes every morning. 06/03/22  Yes [provider]  tiZANidine (ZANAFLEX) 4 MG tablet Take 4 mg by mouth every 8 (eight) hours. 06/29/22  Yes [provider]  timolol (BETIMOL) 0.5 % ophthalmic solution Place 1 drop into both eyes daily. Patient not taking: Reported on 07/05/2022    [provider]   Physical Exam: Vitals:   07/05/22 1120 07/05/22 1120 07/05/22 1249 07/05/22 1526  BP:  110/70 118/72 120/70  Pulse:  76 80 78  Resp:  18 18 18   Temp: 98.1 F (36.7 C)   98 F (36.7 C)  TempSrc: Oral     SpO2:  99% 99% 99%  Weight:      Height:       Constitutional: appears age-appropriate, NAD, calm, comfortable Eyes: PERRL, lids and conjunctivae normal ENMT: Mucous membranes are moist. Posterior pharynx clear of any exudate or lesions. Age-appropriate dentition. Hearing appropriate Neck: normal, supple, no masses, no thyromegaly Respiratory: clear to auscultation bilaterally, no wheezing, no crackles. Normal respiratory effort. No accessory muscle use.  Cardiovascular: Irregular rate and rhythm, no murmurs / rubs / gallops. No extremity edema. 2+ pedal pulses. No carotid bruits.  Abdomen: Obese abdomen, no tenderness, no masses palpated, no hepatosplenomegaly. Bowel sounds positive.  Musculoskeletal: no clubbing / cyanosis. No joint deformity upper and lower extremities. Good ROM, no contractures, no atrophy. Normal muscle tone.  Skin: no rashes, lesions, ulcers. No induration Neurologic: Sensation intact. Strength 5/5 in all 4.   Psychiatric: Normal judgment and insight. Alert and oriented x 3. Normal mood.   EKG: independently reviewed, showing atrial fibrillation with rate of 67, QTc 416  Chest x-ray on Admission: Not indicated at this time  CT Angio Chest/Abd/Pel for Dissection W and/or Wo Contrast  Result Date: 07/05/2022 CLINICAL DATA:  Aortic syndrome suspected. Flank pain. Diaphoretic. EXAM: CT ANGIOGRAPHY CHEST, ABDOMEN AND PELVIS TECHNIQUE: Non-contrast CT of the chest was initially obtained. Multidetector CT imaging through the chest, abdomen and pelvis was performed using the standard protocol during bolus administration of intravenous contrast. Multiplanar reconstructed images and MIPs were obtained and reviewed to evaluate the vascular anatomy. RADIATION DOSE REDUCTION: This exam was performed according to the departmental dose-optimization program which includes automated exposure control, adjustment of the mA and/or kV according to patient size and/or use of iterative reconstruction technique. CONTRAST:  145m OMNIPAQUE IOHEXOL 350 MG/ML SOLN COMPARISON:  CT PA chest CT same day FINDINGS: CTA CHEST FINDINGS Cardiovascular: No aortic dissection or aneurysm. Ascending thoracic aorta measures 45 mm (sagittal image  89/series 10) no acute findings of the great vessels. Descending thoracic aorta normal. Contrast bolus opacifies the pulmonary arteries greater than the aorta. Evidence acute pulmonary embolism. No pericardial fluid. Mediastinum/Nodes: No axillary or supraclavicular adenopathy. No mediastinal or hilar adenopathy. No pericardial fluid. Esophagus normal. Lungs/Pleura: No pneumothorax or pleural fluid. Mild ground-glass densities calcified granuloma in the RIGHT lower lobe Musculoskeletal: No acute osseous abnormality. Review of the MIP images confirms the above findings. CTA ABDOMEN AND PELVIS FINDINGS VASCULAR Aorta: Normal caliber aorta without aneurysm, dissection, vasculitis or significant stenosis. Celiac:  Patent without evidence of aneurysm, dissection, vasculitis or significant stenosis. SMA: Patent without evidence of aneurysm, dissection, vasculitis or significant stenosis. Renals: Both renal arteries are patent without evidence of aneurysm, dissection, vasculitis, fibromuscular dysplasia or significant stenosis. IMA: Patent without evidence of aneurysm, dissection, vasculitis or significant stenosis. Inflow: Patent without evidence of aneurysm, dissection, vasculitis or significant stenosis. Veins: No obvious venous abnormality within the limitations of this arterial phase study. Review of the MIP images confirms the above findings. NON-VASCULAR Hepatobiliary: No focal hepatic lesion. No biliary duct dilatation. Common bile duct is normal. Pancreas: Pancreas is normal. No ductal dilatation. No pancreatic inflammation. Spleen: Normal spleen Adrenals/urinary tract: Adrenal glands and kidneys are normal. The ureters and bladder normal. Stomach/Bowel: Stomach, small bowel, appendix, and cecum are normal. Multiple diverticula of the descending colon and sigmoid colon without acute inflammation. Lymphatic: No lymphadenopathy Reproductive: Prostate hypertrophy. Other: No free fluid. Musculoskeletal: No aggressive osseous lesion. Review of the MIP images confirms the above findings. IMPRESSION: Chest Impression: 1. No acute aortic dissection or acute aneurysm. 2. Aneurysmal dilatation of the ascending thoracic aorta 45 mm. Ascending thoracic aortic aneurysm. Recommend semi-annual imaging followup by CTA or MRA and referral to cardiothoracic surgery if not already obtained. This recommendation follows 2010 ACCF/AHA/AATS/ACR/ASA/SCA/SCAI/SIR/STS/SVM Guidelines for the Diagnosis and Management of Patients With Thoracic Aortic Disease. Circulation. 2010; 121: X528-U132. Aortic aneurysm NOS (ICD10-I71.9) Abdomen / Pelvis Impression: 1. No acute findings of the abdominal aorta or its branches. 2. LEFT colon diverticulosis  without evidence diverticulitis Electronically Signed   By: Suzy Bouchard M.D.   On: 07/05/2022 14:17   CT Angio Chest PE W and/or Wo Contrast  Addendum Date: 07/05/2022   ADDENDUM REPORT: 07/05/2022 13:52 ADDENDUM: The aorta is dilated to 4.4 cm greatest coronal dimension compatible with mild aneurysmal caliber of the ascending thoracic aorta. Recommend annual imaging followup by CTA or MRA. This recommendation follows 2010 ACCF/AHA/AATS/ACR/ASA/SCA/SCAI/SIR/STS/SVM Guidelines for the Diagnosis and Management of Patients with Thoracic Aortic Disease. Circulation. 2010; 121: G401-U272. Aortic aneurysm NOS (ICD10-I71.9) These results were called by telephone at the time of interpretation on 07/05/2022 at 1:52 pm to provider [facility] , who verbally acknowledged these results. Electronically Signed   By: Zetta Bills M.D.   On: 07/05/2022 13:52   Result Date: 07/05/2022 CLINICAL DATA:  A 66 year old male presents with RIGHT sided back pain flank pain since Thursday. EXAM: CT ANGIOGRAPHY CHEST WITH CONTRAST TECHNIQUE: Multidetector CT imaging of the chest was performed using the standard protocol during bolus administration of intravenous contrast. Multiplanar CT image reconstructions and MIPs were obtained to evaluate the vascular anatomy. RADIATION DOSE REDUCTION: This exam was performed according to the departmental dose-optimization program which includes automated exposure control, adjustment of the mA and/or kV according to patient size and/or use of iterative reconstruction technique. CONTRAST:  74m OMNIPAQUE IOHEXOL 350 MG/ML SOLN COMPARISON:  None available FINDINGS: Cardiovascular: Aortic atherosclerotic plaque with calcification. Aorta shows smooth contours without aneurysmal dilation.  Heart size mild to moderately enlarged without pericardial effusion or nodularity. Central pulmonary vasculature opacified to 324 Hounsfield units. Studies negative for pulmonary embolism. Mediastinum/Nodes: No  thoracic inlet, axillary, mediastinal or hilar adenopathy. Esophagus grossly normal. There are signs of prior granulomatous disease with calcified lymph nodes in the mediastinum Lungs/Pleura: Mosaic attenuation throughout the chest. No lobar consolidative process. No pleural effusion or pneumothorax. Signs of prior granulomatous disease in the chest. Airways are patent. Upper Abdomen: Hepatic steatosis. Fissural widening of hepatic fissures. No acute process related to liver, visualized gallbladder, visualized pancreas, spleen, adrenal glands or gastrointestinal tract. Musculoskeletal: No acute bone finding. No destructive bone process. Spinal degenerative changes. Degenerative changes are moderate to marked in the visualized cervical and thoracic spine greatest at C6-7. Review of the MIP images confirms the above findings. IMPRESSION: 1. Negative for pulmonary embolism. 2. Mosaic attenuation throughout the chest, query air trapping and small airways disease. Correlate with any respiratory symptoms. 3. Signs of prior granulomatous disease in the chest. 4. Hepatic steatosis with widening of hepatic fissures, correlate with any clinical or laboratory evidence of liver disease. 5. Degenerative changes of the spine most pronounced at C6-7 which is partially imaged. 6. Aortic atherosclerosis. Aortic Atherosclerosis (ICD10-I70.0). Electronically Signed: By: Zetta Bills M.D. On: 07/05/2022 13:32   CT HEAD WO CONTRAST (5MM)  Result Date: 07/05/2022 CLINICAL DATA:  Sudden severe headache. Patient reports a stent in right eye. EXAM: CT HEAD WITHOUT CONTRAST TECHNIQUE: Contiguous axial images were obtained from the base of the skull through the vertex without intravenous contrast. RADIATION DOSE REDUCTION: This exam was performed according to the departmental dose-optimization program which includes automated exposure control, adjustment of the mA and/or kV according to patient size and/or use of iterative reconstruction  technique. COMPARISON:  None Available. FINDINGS: Brain: There is mild cortical atrophy, within normal limits for patient age. The ventricles are normal in configuration. The basilar cisterns are patent. No mass, mass effect, or midline shift. No acute intracranial hemorrhage is seen. No abnormal extra-axial fluid collection. Mild patchy subcortical white matter hypodensities are compatible with very mild chronic ischemic white matter changes. Preservation of the normal cortical gray-white interface without CT evidence of an acute major vascular territorial cortical based infarction. Vascular: No hyperdense vessel or unexpected calcification. Skull: Normal. Negative for fracture or focal lesion. Sinuses/Orbits: Postsurgical changes of right lens replacement. The orbital globes are normal in size and morphology. Minimal partially visualized peripheral inferior left maxillary sinus fibroid mucosal thickening. The mastoid air cells are clear. Other: None. IMPRESSION: No acute intracranial process. Electronically Signed   By: Yvonne Kendall M.D.   On: 07/05/2022 13:31    Labs on Admission: I have personally reviewed following labs  CBC: Recent Labs  Lab 07/05/22 0935  WBC 9.7  HGB 11.7*  HCT 38.4*  MCV 79.7*  PLT 081   Basic Metabolic Panel: Recent Labs  Lab 07/05/22 0935  NA 138  K 4.0  CL 107  CO2 23  GLUCOSE 112*  BUN 27*  CREATININE 1.24  CALCIUM 9.1   GFR: Estimated Creatinine Clearance: 71.3 mL/min (by C-G formula based on SCr of 1.24 mg/dL).  Liver Function Tests: Recent Labs  Lab 07/05/22 0935  AST 21  ALT 14  ALKPHOS 69  BILITOT 0.7  PROT 7.6  ALBUMIN 4.0   Recent Labs  Lab 07/05/22 0935  LIPASE 38   Urine analysis:    Component Value Date/Time   COLORURINE YELLOW (A) 07/05/2022 0934   APPEARANCEUR HAZY (A)  07/05/2022 [place] 08/18/2012 1420   LABSPEC 1.027 07/05/2022 0934   LABSPEC 1.020 08/18/2012 1420   PHURINE 5.0 07/05/2022 0934    GLUCOSEU NEGATIVE 07/05/2022 0934   GLUCOSEU Negative 08/18/2012 1420   HGBUR NEGATIVE 07/05/2022 0934   BILIRUBINUR NEGATIVE 07/05/2022 0934   BILIRUBINUR negative 05/20/2020 1359   BILIRUBINUR Negative 08/18/2012 1420   KETONESUR NEGATIVE 07/05/2022 0934   PROTEINUR 30 (A) 07/05/2022 0934   UROBILINOGEN 0.2 05/20/2020 1359   NITRITE NEGATIVE 07/05/2022 0934   LEUKOCYTESUR NEGATIVE 07/05/2022 0934   LEUKOCYTESUR Negative 08/18/2012 1420   Dr. Tobie Poet [facility]  If 7PM-7AM, please contact overnight-coverage provider If 7AM-7PM, please contact day coverage provider [email or web address]  07/05/2022, 5:36 PM

## 2022-07-05 NOTE — Assessment & Plan Note (Addendum)
-   Patient is status post metal stent placement 1 to 2 years ago - Patient was told that he would not be a candidate for MRI of the brain

## 2022-07-05 NOTE — Assessment & Plan Note (Addendum)
-   Query infectious etiology (pneumonia) versus heart failure - Check procalcitonin and BNP -Complete echo ordered - If echo is negative would recommend a.m. team discharge patient with Holter monitor for follow-up with his outpatient cardiologist - Fall precautions

## 2022-07-05 NOTE — ED Triage Notes (Addendum)
Pt here with right side back and flank pain since last Thursday. Pt states pain radiates up to under his shoulder blade. Pt had a diaphoretic episode at [facility]. Pt has a metal stent in his right eye.

## 2022-07-05 NOTE — Assessment & Plan Note (Addendum)
-   Etiology work-up in progress at this time - Differentials include demand ischemia secondary to new heart failure versus pneumonia - Check BNP, procalcitonin - BNP was negative however given obesity, this may be of false negative - Complete echo ordered

## 2022-07-05 NOTE — ED Notes (Signed)
Report received and care assumed.

## 2022-07-05 NOTE — ED Notes (Signed)
C/O back pain, 7/10.  Med given.

## 2022-07-05 NOTE — Assessment & Plan Note (Addendum)
-   Patient was diagnosed with atrial fibrillation approximately 18 months ago - Patient is currently not on anticoagulation per cardiology note on 11/30/2021

## 2022-07-06 ENCOUNTER — Observation Stay
Admit: 2022-07-06 | Discharge: 2022-07-06 | Disposition: A | Discharge status: Home / Self Care | Payer: Medicare PPO | Attending: Internal Medicine | Admitting: Internal Medicine

## 2022-07-06 DIAGNOSIS — R55 Syncope and collapse: Secondary | ICD-10-CM | POA: Diagnosis not present

## 2022-07-06 DIAGNOSIS — I10 Essential (primary) hypertension: Secondary | ICD-10-CM | POA: Diagnosis not present

## 2022-07-06 DIAGNOSIS — I480 Paroxysmal atrial fibrillation: Secondary | ICD-10-CM

## 2022-07-06 LAB — BASIC METABOLIC PANEL
Anion gap: 10 (ref 5–15)
BUN: 22 mg/dL (ref 8–23)
CO2: 21 mmol/L — ABNORMAL LOW (ref 22–32)
Calcium: 8.6 mg/dL — ABNORMAL LOW (ref 8.9–10.3)
Chloride: 109 mmol/L (ref 98–111)
Creatinine, Ser: 1.17 mg/dL (ref 0.61–1.24)
GFR, Estimated: >60 mL/min (ref >60)
Glucose, Bld: 87 mg/dL (ref 70–99)
Potassium: 3.9 mmol/L (ref 3.5–5.1)
Sodium: 140 mmol/L (ref 135–145)

## 2022-07-06 LAB — ECHOCARDIOGRAM COMPLETE
AR max vel: 2.36 cm2
AV Area VTI: 1.93 cm2
AV Area mean vel: 2.16 cm2
AV Mean grad: 3 mmHg
AV Peak grad: 4.2 mmHg
Ao pk vel: 1.02 m/s
Area-P 1/2: 3.77 cm2
Calc EF: 37.9 %
Height: 70 in
S' Lateral: 4.3 cm
Single Plane A2C EF: 36.9 %
Single Plane A4C EF: 39.6 %
Weight: 3936.53 oz

## 2022-07-06 LAB — CBC
HCT: 35.5 % — ABNORMAL LOW (ref 39.0–52.0)
Hemoglobin: 10.7 g/dL — ABNORMAL LOW (ref 13.0–17.0)
MCH: 24.2 pg — ABNORMAL LOW (ref 26.0–34.0)
MCHC: 30.1 g/dL (ref 30.0–36.0)
MCV: 80.1 fL (ref 80.0–100.0)
Platelets: 271 10*3/uL (ref 150–400)
RBC: 4.43 MIL/uL (ref 4.22–5.81)
RDW: 15.9 % — ABNORMAL HIGH (ref 11.5–15.5)
WBC: 9.6 10*3/uL (ref 4.0–10.5)
nRBC: 0 % (ref 0.0–0.2)

## 2022-07-06 MED ORDER — ORAL CARE MOUTH RINSE: 15.0000 mL | OROMUCOSAL | Status: DC | PRN

## 2022-07-06 MED ORDER — ATORVASTATIN CALCIUM 20 MG PO TABS: 80.0000 mg | ORAL_TABLET | Freq: Every day | ORAL | Status: DC

## 2022-07-06 NOTE — Progress Notes (Signed)
*  PRELIMINARY RESULTS* Echocardiogram 2D Echocardiogram has been performed.  Cody Golden Cody Golden 07/06/2022, 9:08 AM

## 2022-07-06 NOTE — Discharge Summary (Signed)
Physician Discharge Summary   Patient: Cody Golden MRN: [id number] DOB: 13-Sep-1956  Admit date:     07/05/2022  Discharge date: 07/06/22  Discharge Physician: Sharen Hones   PCP: Derinda Late, MD   Recommendations at discharge:   Follow-up with PCP in 1 week. Follow-up with cardiology which has been scheduled by Dr. Mabeline Caras in the office.  Discharge Diagnoses: Principal Problem:   Near syncope Active Problems:   Essential hypertension   OSA (obstructive sleep apnea)   Atrial fibrillation (HCC)   Elevated troponin   MRI contraindicated due to metal implant   Flank pain   Obesity, Class II, BMI 35-39.9  Resolved Problems:   * No resolved hospital problems. [facility] Course: 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 Patient moved some heavy furniture on Wednesday, he sustained significant pain in his bilateral shoulders, he went to the orthopedics, was diagnosed with a tear in his quadricep muscle.  Since then, the pain has been persistent and worse with movement.  He went back to the orthopedist office yesterday as pain was not resolving.  While in the office, patient had an episode of severe pain, then he became diaphoretic.  After that, he became very dizzy.  By time to come to the hospital, he was found to have a troponin at 117, EKG showed incomplete RBBB, flattened T waves.  No ST elevation.  Patient condition appears to be caused by the following: Muscle injury caused pain, pain caused diaphoresis, then blood pressure dropped due to diaphoresis, which explains the dizziness, further caused demand ischemia from transient hypotension.  Patient is seen by cardiology today, cleared for discharge.  Patient currently is medically stable to be discharged.  He will follow-up with cardiology for future work-up.   Assessment and  Plan: * Near syncope Source of near syncope is described above. Patient be followed by cardiology in the office.  Thoracic aortic aneurysm. Incidental finding on CT scan showed 45 mm of dilation of aorta.  No evidence of aortic dissection.  Follow-up with PCP and may be referred to vascular surgery.  Obesity, Class II, BMI 35-39.9 Diet and exercise advised  Flank pain and shoulder pain Symptoms happened after moving heavy furniture.  Continue follow-up with orthopedics.  Elevated troponin, myocardial injury. Appears to be demand ischemia from transient hypotension from near syncope. Follow-up with cardiology as outpatient.  Paroxysmal atrial fibrillation ([place]) - Patient was diagnosed with atrial fibrillation approximately 18 months ago - Patient is currently not on anticoagulation per cardiology note on 11/30/2021  Essential hypertension Resume home medicines.        Consultants: Cardiology Procedures performed: None  Disposition: Home Diet recommendation:  Discharge Diet Orders (From admission, onward)     Start     Ordered   07/06/22 0000  Diet - low sodium heart healthy        07/06/22 1148           Cardiac diet DISCHARGE MEDICATION: Allergies as of 07/06/2022   No Known Allergies      Medication List     STOP taking these medications    timolol 0.5 % ophthalmic solution Commonly known as: BETIMOL       TAKE these medications    aspirin 81 MG chewable tablet Chew 81 mg by mouth daily.   co-enzyme Q-10 30 MG capsule Take 30 mg by mouth  daily.   latanoprost 0.005 % ophthalmic solution Commonly known as: XALATAN Place 1 drop into both eyes at bedtime.   losartan 50 MG tablet Commonly known as: COZAAR Take 50 mg by mouth daily.   multivitamin tablet Take 1 tablet by mouth daily.   timolol 0.5 % ophthalmic solution Commonly known as: TIMOPTIC Place 1 drop into both eyes every morning.   tiZANidine 4 MG tablet Commonly known as:  ZANAFLEX Take 4 mg by mouth every 8 (eight) hours.        Follow-up Information     Corey Skains, MD. Go in 1 week(s).   Specialty: Cardiology Contact information: [address] [place] [place] [address] [phone]         Derinda Late, MD Follow up in 1 week(s).   Specialty: Family Medicine Contact information: [address] [facility] [place] [address] [phone]                Discharge Exam: Danley Danker Weights   07/05/22 0933  Weight: 111.6 kg   General exam: Appears calm and comfortable  Respiratory system: Clear to auscultation. Respiratory effort normal. Cardiovascular system: S1 & S2 heard, RRR. No JVD, murmurs, rubs, gallops or clicks. No pedal edema. Gastrointestinal system: Abdomen is nondistended, soft and nontender. No organomegaly or masses felt. Normal bowel sounds heard. Central nervous system: Alert and oriented. No focal neurological deficits. Extremities: Symmetric 5 x 5 power. Skin: No rashes, lesions or ulcers Psychiatry: Judgement and insight appear normal. Mood & affect appropriate.    Condition at discharge: good  The results of significant diagnostics from this hospitalization (including imaging, microbiology, ancillary and laboratory) are listed below for reference.   Imaging Studies: CT Angio Chest/Abd/Pel for Dissection W and/or Wo Contrast  Result Date: 07/05/2022 CLINICAL DATA:  Aortic syndrome suspected. Flank pain. Diaphoretic. EXAM: CT ANGIOGRAPHY CHEST, ABDOMEN AND PELVIS TECHNIQUE: Non-contrast CT of the chest was initially obtained. Multidetector CT imaging through the chest, abdomen and pelvis was performed using the standard protocol during bolus administration of intravenous contrast. Multiplanar reconstructed images and MIPs were obtained and reviewed to evaluate the vascular anatomy. RADIATION DOSE REDUCTION: This exam was performed  according to the departmental dose-optimization program which includes automated exposure control, adjustment of the mA and/or kV according to patient size and/or use of iterative reconstruction technique. CONTRAST:  168m OMNIPAQUE IOHEXOL 350 MG/ML SOLN COMPARISON:  CT PA chest CT same day FINDINGS: CTA CHEST FINDINGS Cardiovascular: No aortic dissection or aneurysm. Ascending thoracic aorta measures 45 mm (sagittal image 89/series 10) no acute findings of the great vessels. Descending thoracic aorta normal. Contrast bolus opacifies the pulmonary arteries greater than the aorta. Evidence acute pulmonary embolism. No pericardial fluid. Mediastinum/Nodes: No axillary or supraclavicular adenopathy. No mediastinal or hilar adenopathy. No pericardial fluid. Esophagus normal. Lungs/Pleura: No pneumothorax or pleural fluid. Mild ground-glass densities calcified granuloma in the RIGHT lower lobe Musculoskeletal: No acute osseous abnormality. Review of the MIP images confirms the above findings. CTA ABDOMEN AND PELVIS FINDINGS VASCULAR Aorta: Normal caliber aorta without aneurysm, dissection, vasculitis or significant stenosis. Celiac: Patent without evidence of aneurysm, dissection, vasculitis or significant stenosis. SMA: Patent without evidence of aneurysm, dissection, vasculitis or significant stenosis. Renals: Both renal arteries are patent without evidence of aneurysm, dissection, vasculitis, fibromuscular dysplasia or significant stenosis. IMA: Patent without evidence of aneurysm, dissection, vasculitis or significant stenosis. Inflow: Patent without evidence of aneurysm, dissection, vasculitis or significant stenosis. Veins:  No obvious venous abnormality within the limitations of this arterial phase study. Review of the MIP images confirms the above findings. NON-VASCULAR Hepatobiliary: No focal hepatic lesion. No biliary duct dilatation. Common bile duct is normal. Pancreas: Pancreas is normal. No ductal  dilatation. No pancreatic inflammation. Spleen: Normal spleen Adrenals/urinary tract: Adrenal glands and kidneys are normal. The ureters and bladder normal. Stomach/Bowel: Stomach, small bowel, appendix, and cecum are normal. Multiple diverticula of the descending colon and sigmoid colon without acute inflammation. Lymphatic: No lymphadenopathy Reproductive: Prostate hypertrophy. Other: No free fluid. Musculoskeletal: No aggressive osseous lesion. Review of the MIP images confirms the above findings. IMPRESSION: Chest Impression: 1. No acute aortic dissection or acute aneurysm. 2. Aneurysmal dilatation of the ascending thoracic aorta 45 mm. Ascending thoracic aortic aneurysm. Recommend semi-annual imaging followup by CTA or MRA and referral to cardiothoracic surgery if not already obtained. This recommendation follows 2010 ACCF/AHA/AATS/ACR/ASA/SCA/SCAI/SIR/STS/SVM Guidelines for the Diagnosis and Management of Patients With Thoracic Aortic Disease. Circulation. 2010; 121: E332-R518. Aortic aneurysm NOS (ICD10-I71.9) Abdomen / Pelvis Impression: 1. No acute findings of the abdominal aorta or its branches. 2. LEFT colon diverticulosis without evidence diverticulitis Electronically Signed   By: Suzy Bouchard M.D.   On: 07/05/2022 14:17   CT Angio Chest PE W and/or Wo Contrast  Addendum Date: 07/05/2022   ADDENDUM REPORT: 07/05/2022 13:52 ADDENDUM: The aorta is dilated to 4.4 cm greatest coronal dimension compatible with mild aneurysmal caliber of the ascending thoracic aorta. Recommend annual imaging followup by CTA or MRA. This recommendation follows 2010 ACCF/AHA/AATS/ACR/ASA/SCA/SCAI/SIR/STS/SVM Guidelines for the Diagnosis and Management of Patients with Thoracic Aortic Disease. Circulation. 2010; 121: A416-S063. Aortic aneurysm NOS (ICD10-I71.9) These results were called by telephone at the time of interpretation on 07/05/2022 at 1:52 pm to provider [facility] , who verbally acknowledged these results.  Electronically Signed   By: Zetta Bills M.D.   On: 07/05/2022 13:52   Result Date: 07/05/2022 CLINICAL DATA:  A 66 year old male presents with RIGHT sided back pain flank pain since Thursday. EXAM: CT ANGIOGRAPHY CHEST WITH CONTRAST TECHNIQUE: Multidetector CT imaging of the chest was performed using the standard protocol during bolus administration of intravenous contrast. Multiplanar CT image reconstructions and MIPs were obtained to evaluate the vascular anatomy. RADIATION DOSE REDUCTION: This exam was performed according to the departmental dose-optimization program which includes automated exposure control, adjustment of the mA and/or kV according to patient size and/or use of iterative reconstruction technique. CONTRAST:  72m OMNIPAQUE IOHEXOL 350 MG/ML SOLN COMPARISON:  None available FINDINGS: Cardiovascular: Aortic atherosclerotic plaque with calcification. Aorta shows smooth contours without aneurysmal dilation. Heart size mild to moderately enlarged without pericardial effusion or nodularity. Central pulmonary vasculature opacified to 324 Hounsfield units. Studies negative for pulmonary embolism. Mediastinum/Nodes: No thoracic inlet, axillary, mediastinal or hilar adenopathy. Esophagus grossly normal. There are signs of prior granulomatous disease with calcified lymph nodes in the mediastinum Lungs/Pleura: Mosaic attenuation throughout the chest. No lobar consolidative process. No pleural effusion or pneumothorax. Signs of prior granulomatous disease in the chest. Airways are patent. Upper Abdomen: Hepatic steatosis. Fissural widening of hepatic fissures. No acute process related to liver, visualized gallbladder, visualized pancreas, spleen, adrenal glands or gastrointestinal tract. Musculoskeletal: No acute bone finding. No destructive bone process. Spinal degenerative changes. Degenerative changes are moderate to marked in the visualized cervical and thoracic spine greatest at C6-7. Review of the  MIP images confirms the above findings. IMPRESSION: 1. Negative for pulmonary embolism. 2. Mosaic attenuation throughout the chest, query air trapping and  small airways disease. Correlate with any respiratory symptoms. 3. Signs of prior granulomatous disease in the chest. 4. Hepatic steatosis with widening of hepatic fissures, correlate with any clinical or laboratory evidence of liver disease. 5. Degenerative changes of the spine most pronounced at C6-7 which is partially imaged. 6. Aortic atherosclerosis. Aortic Atherosclerosis (ICD10-I70.0). Electronically Signed: By: Zetta Bills M.D. On: 07/05/2022 13:32   CT HEAD WO CONTRAST (5MM)  Result Date: 07/05/2022 CLINICAL DATA:  Sudden severe headache. Patient reports a stent in right eye. EXAM: CT HEAD WITHOUT CONTRAST TECHNIQUE: Contiguous axial images were obtained from the base of the skull through the vertex without intravenous contrast. RADIATION DOSE REDUCTION: This exam was performed according to the departmental dose-optimization program which includes automated exposure control, adjustment of the mA and/or kV according to patient size and/or use of iterative reconstruction technique. COMPARISON:  None Available. FINDINGS: Brain: There is mild cortical atrophy, within normal limits for patient age. The ventricles are normal in configuration. The basilar cisterns are patent. No mass, mass effect, or midline shift. No acute intracranial hemorrhage is seen. No abnormal extra-axial fluid collection. Mild patchy subcortical white matter hypodensities are compatible with very mild chronic ischemic white matter changes. Preservation of the normal cortical gray-white interface without CT evidence of an acute major vascular territorial cortical based infarction. Vascular: No hyperdense vessel or unexpected calcification. Skull: Normal. Negative for fracture or focal lesion. Sinuses/Orbits: Postsurgical changes of right lens replacement. The orbital globes are  normal in size and morphology. Minimal partially visualized peripheral inferior left maxillary sinus fibroid mucosal thickening. The mastoid air cells are clear. Other: None. IMPRESSION: No acute intracranial process. Electronically Signed   By: Yvonne Kendall M.D.   On: 07/05/2022 13:31    Microbiology: Results for orders placed or performed during the hospital encounter of 11/17/20  SARS CORONAVIRUS 2 (TAT 6-24 HRS) Nasopharyngeal Nasopharyngeal Swab     Status: Abnormal   Collection Time: 11/17/20 10:50 AM   Specimen: Nasopharyngeal Swab  Result Value Ref Range Status   SARS Coronavirus 2 POSITIVE (A) NEGATIVE Final    Comment: (NOTE) SARS-CoV-2 target nucleic acids are DETECTED.  The SARS-CoV-2 RNA is generally detectable in upper and lower respiratory specimens during the acute phase of infection. Positive results are indicative of the presence of SARS-CoV-2 RNA. Clinical correlation with patient history and other diagnostic information is  necessary to determine patient infection status. Positive results do not rule out bacterial infection or co-infection with other viruses.  The expected result is Negative.  Fact Sheet for Patients: SugarRoll.be  Fact Sheet for Healthcare Providers: https://www.woods-mathews.com/  This test is not yet approved or cleared by the [place] FDA and  has been authorized for detection and/or diagnosis of SARS-CoV-2 by FDA under an Emergency Use Authorization (EUA). This EUA will remain  in effect (meaning this test can be used) for the duration of the COVID-19 declaration under Section 564(b)(1) of the Act, 21 U. S.C. section 360bbb-3(b)(1), unless the authorization is terminated or revoked sooner.   Performed at [facility], [place] [address] [place], [place] [address]     Labs: CBC: Recent Labs  Lab 07/05/22 0935 07/06/22 0307  WBC 9.7 9.6  HGB 11.7* 10.7*  HCT 38.4* 35.5*  MCV 79.7*  80.1  PLT 342 428   Basic Metabolic Panel: Recent Labs  Lab 07/05/22 0935 07/06/22 0307  NA 138 140  K 4.0 3.9  CL 107 109  CO2 23 21*  GLUCOSE 112* 87  BUN 27* 22  CREATININE 1.24 1.17  CALCIUM 9.1 8.6*   Liver Function Tests: Recent Labs  Lab 07/05/22 0935  AST 21  ALT 14  ALKPHOS 69  BILITOT 0.7  PROT 7.6  ALBUMIN 4.0   CBG: No results for input(s): "GLUCAP" in the last 168 hours.  Discharge time spent: greater than 30 minutes.  Signed: Sharen Hones, MD [facility] 07/06/2022

## 2022-07-06 NOTE — Consult Note (Addendum)
[place] NOTE       Patient ID: LUCUS LAMBERTSON MRN: [id number] DOB/AGE: [birth year] 66 y.o.  Admit date: 07/05/2022 Referring Physician Dr. Roosevelt Locks Primary Physician Dr. Baldemar Lenis Primary Cardiologist Dr. Ubaldo Glassing  Reason for Consultation elevated tropnin   HPI: Ioannis Schuh is a [phone] with a PMH of chronic atrial fibrillation not on anticoagulation (s/p unsuccessful DCCV years ago), hypertension, OSA noncompliant with CPAP, obesity who presented to [facility] ED 07/05/2022 from [facility] after a near syncopal episode.  Cardiology is consulted for further assistance.  Patient presents with his wife who contributes to the history.  He has been having ongoing right-sided back pain for the past week and seen by [facility] who attributed this discomfort to a muscle strain.  He was started on a steroid taper and Zanaflex in addition to lidocaine patches.  He return for follow-up yesterday after having continued thoracic back and flank pain that was not any better after this interventions.  He said he was standing during that appointment and he was in a lot of pain and he "thought he was going to pass out" he felt sweaty and dizzy and had 2 instances of several seconds of being less responsive that resolved quickly.  He denies falling or complete loss of consciousness.  He denied any chest discomfort, palpitations, or shortness of breath associated with that episode.  Other than his back pain, he has been feeling about at his baseline without peripheral edema, orthopnea, or dizziness.  At my time of evaluation patient is sitting upright in recliner, eager to go home.  He felt well overnight without recurrence in his presyncopal symptoms, has been ambulatory to the restroom without assistance or dizziness.  He had extensive work-up in the emergency department including a CTA that was negative for pulmonary embolus, head CT negative for acute intracranial process, and CT abdomen pelvis was without  acute aortic dissection, acute aneurysm but did show a 4.5 cm ascending thoracic aortic aneurysm for which follow-up was recommended.  His EKG shows atrial fibrillation with an incomplete RBBB rate of 67 without acute ST or T wave abnormalities.  His vitals are notable for a blood pressure of 150/83, heart rate 92 in atrial fibrillation on telemetry, SPO2 95% on room air.  Labs are notable for a potassium of 3.9, BUN/creatinine 22/1.17 GFR greater than 60.  BNP negative at 74.4, high-sensitivity troponin minimally elevated with a flat trend at 1 17-100 of unclear clinical significance.  Negative procalcitonin.  H&H stable at 10.7/35.5 platelets 271.  Review of systems complete and found to be negative unless listed above     Past Medical History:  Diagnosis Date   Arthritis    Atrial fibrillation ([place])    chronic   COVID-19 11/02/2019   Dysrhythmia    A-fib    Past Surgical History:  Procedure Laterality Date   CARDIOVERSION     x3 done years ago without conversion   CATARACT EXTRACTION W/PHACO Right 11/28/2020   Procedure: CATARACT EXTRACTION PHACO AND INTRAOCULAR LENS PLACEMENT (Minor) RIGHT ISTENT INJ 1.80 00:16.1;  Surgeon: Eulogio Bear, MD;  Location: [place];  Service: Ophthalmology;  Laterality: Right;  covid + 11-16-20   COLONOSCOPY WITH PROPOFOL N/A 03/09/2022   Procedure: COLONOSCOPY WITH PROPOFOL;  Surgeon: Lesly Rubenstein, MD;  Location: [facility];  Service: Endoscopy;  Laterality: N/A;   SHOULDER ARTHROSCOPY     WEIL OSTEOTOMY Right 03/24/2015   Procedure: 2ND -3RD METARSAL WEIL OSTEOTOMY,  PLANTAR PLATE REPAIR ;  Surgeon: Wylene Simmer, MD;  Location: [place];  Service: Orthopedics;  Laterality: Right;    (Not in a hospital admission)  Social History   Socioeconomic History   Marital status: Married    Spouse name: Not on file   Number of children: Not on file   Years of education: Not on file   Highest education level: Not on  file  Occupational History   Not on file  Tobacco Use   Smoking status: Never   Smokeless tobacco: Never  Vaping Use   Vaping Use: Never used  Substance and Sexual Activity   Alcohol use: No   Drug use: No   Sexual activity: Not on file  Other Topics Concern   Not on file  Social History Narrative   Not on file   Social Determinants of Health   Financial Resource Strain: Not on file  Food Insecurity: No Food Insecurity (07/06/2022)   Hunger Vital Sign    Worried About Running Out of Food in the Last Year: Never true    Ran Out of Food in the Last Year: Never true  Transportation Needs: No Transportation Needs (07/06/2022)   PRAPARE - Hydrologist (Medical): No    Lack of Transportation (Non-Medical): No  Physical Activity: Not on file  Stress: Not on file  Social Connections: Not on file  Intimate Partner Violence: Not At Risk (07/06/2022)   Humiliation, Afraid, Rape, and Kick questionnaire    Fear of Current or Ex-Partner: No    Emotionally Abused: No    Physically Abused: No    Sexually Abused: No    Family History  Adopted: Yes     PHYSICAL EXAM General: Well-appearing conversational elderly Caucasian male, sitting upright in recliner with his wife at bedside. HEENT:  Normocephalic and atraumatic. Neck:  No JVD.  Lungs: Normal respiratory effort on room air. Clear bilaterally to auscultation. No wheezes, crackles, rhonchi.  Heart: Irregularly irregular with controlled rate. Normal S1 and S2 without gallops or murmurs.  Abdomen: Obese appearing.  Msk: Normal strength and tone for age.  Nontender to palpation along his right flank. Extremities: Warm and well perfused. No clubbing, cyanosis.  No peripheral edema.  Neuro: Alert and oriented X 3. Psych:  Answers questions appropriately.   Labs: Basic Metabolic Panel: Recent Labs    07/05/22 0935 07/06/22 0307  NA 138 140  K 4.0 3.9  CL 107 109  CO2 23 21*  GLUCOSE 112* 87  BUN 27*  22  CREATININE 1.24 1.17  CALCIUM 9.1 8.6*   Liver Function Tests: Recent Labs    07/05/22 0935  AST 21  ALT 14  ALKPHOS 69  BILITOT 0.7  PROT 7.6  ALBUMIN 4.0   Recent Labs    07/05/22 0935  LIPASE 38   CBC: Recent Labs    07/05/22 0935 07/06/22 0307  WBC 9.7 9.6  HGB 11.7* 10.7*  HCT 38.4* 35.5*  MCV 79.7* 80.1  PLT 342 271   Cardiac Enzymes: Recent Labs    07/05/22 0935 07/05/22 1228  TROPONINIHS 117* 100*   BNP: Invalid input(s): "POCBNP" D-Dimer: No results for input(s): "DDIMER" in the last 72 hours. Hemoglobin A1C: No results for input(s): "HGBA1C" in the last 72 hours. Fasting Lipid Panel: No results for input(s): "CHOL", "HDL", "LDLCALC", "TRIG", "CHOLHDL", "LDLDIRECT" in the last 72 hours. Thyroid Function Tests: No results for input(s): "TSH", "T4TOTAL", "T3FREE", "THYROIDAB" in the last 72 hours.  Invalid input(s): "FREET3"  Anemia Panel: No results for input(s): "VITAMINB12", "FOLATE", "FERRITIN", "TIBC", "IRON", "RETICCTPCT" in the last 72 hours.  CT Angio Chest/Abd/Pel for Dissection W and/or Wo Contrast  Result Date: 07/05/2022 CLINICAL DATA:  Aortic syndrome suspected. Flank pain. Diaphoretic. EXAM: CT ANGIOGRAPHY CHEST, ABDOMEN AND PELVIS TECHNIQUE: Non-contrast CT of the chest was initially obtained. Multidetector CT imaging through the chest, abdomen and pelvis was performed using the standard protocol during bolus administration of intravenous contrast. Multiplanar reconstructed images and MIPs were obtained and reviewed to evaluate the vascular anatomy. RADIATION DOSE REDUCTION: This exam was performed according to the departmental dose-optimization program which includes automated exposure control, adjustment of the mA and/or kV according to patient size and/or use of iterative reconstruction technique. CONTRAST:  130m OMNIPAQUE IOHEXOL 350 MG/ML SOLN COMPARISON:  CT PA chest CT same day FINDINGS: CTA CHEST FINDINGS Cardiovascular: No  aortic dissection or aneurysm. Ascending thoracic aorta measures 45 mm (sagittal image 89/series 10) no acute findings of the great vessels. Descending thoracic aorta normal. Contrast bolus opacifies the pulmonary arteries greater than the aorta. Evidence acute pulmonary embolism. No pericardial fluid. Mediastinum/Nodes: No axillary or supraclavicular adenopathy. No mediastinal or hilar adenopathy. No pericardial fluid. Esophagus normal. Lungs/Pleura: No pneumothorax or pleural fluid. Mild ground-glass densities calcified granuloma in the RIGHT lower lobe Musculoskeletal: No acute osseous abnormality. Review of the MIP images confirms the above findings. CTA ABDOMEN AND PELVIS FINDINGS VASCULAR Aorta: Normal caliber aorta without aneurysm, dissection, vasculitis or significant stenosis. Celiac: Patent without evidence of aneurysm, dissection, vasculitis or significant stenosis. SMA: Patent without evidence of aneurysm, dissection, vasculitis or significant stenosis. Renals: Both renal arteries are patent without evidence of aneurysm, dissection, vasculitis, fibromuscular dysplasia or significant stenosis. IMA: Patent without evidence of aneurysm, dissection, vasculitis or significant stenosis. Inflow: Patent without evidence of aneurysm, dissection, vasculitis or significant stenosis. Veins: No obvious venous abnormality within the limitations of this arterial phase study. Review of the MIP images confirms the above findings. NON-VASCULAR Hepatobiliary: No focal hepatic lesion. No biliary duct dilatation. Common bile duct is normal. Pancreas: Pancreas is normal. No ductal dilatation. No pancreatic inflammation. Spleen: Normal spleen Adrenals/urinary tract: Adrenal glands and kidneys are normal. The ureters and bladder normal. Stomach/Bowel: Stomach, small bowel, appendix, and cecum are normal. Multiple diverticula of the descending colon and sigmoid colon without acute inflammation. Lymphatic: No lymphadenopathy  Reproductive: Prostate hypertrophy. Other: No free fluid. Musculoskeletal: No aggressive osseous lesion. Review of the MIP images confirms the above findings. IMPRESSION: Chest Impression: 1. No acute aortic dissection or acute aneurysm. 2. Aneurysmal dilatation of the ascending thoracic aorta 45 mm. Ascending thoracic aortic aneurysm. Recommend semi-annual imaging followup by CTA or MRA and referral to cardiothoracic surgery if not already obtained. This recommendation follows 2010 ACCF/AHA/AATS/ACR/ASA/SCA/SCAI/SIR/STS/SVM Guidelines for the Diagnosis and Management of Patients With Thoracic Aortic Disease. Circulation. 2010; 121:: I948-N462 Aortic aneurysm NOS (ICD10-I71.9) Abdomen / Pelvis Impression: 1. No acute findings of the abdominal aorta or its branches. 2. LEFT colon diverticulosis without evidence diverticulitis Electronically Signed   By: SSuzy BouchardM.D.   On: 07/05/2022 14:17   CT Angio Chest PE W and/or Wo Contrast  Addendum Date: 07/05/2022   ADDENDUM REPORT: 07/05/2022 13:52 ADDENDUM: The aorta is dilated to 4.4 cm greatest coronal dimension compatible with mild aneurysmal caliber of the ascending thoracic aorta. Recommend annual imaging followup by CTA or MRA. This recommendation follows 2010 ACCF/AHA/AATS/ACR/ASA/SCA/SCAI/SIR/STS/SVM Guidelines for the Diagnosis and Management of Patients with Thoracic Aortic Disease. Circulation. 2010; 121:: V035-K093 Aortic aneurysm NOS (ICD10-I71.9)  These results were called by telephone at the time of interpretation on 07/05/2022 at 1:52 pm to provider [facility] , who verbally acknowledged these results. Electronically Signed   By: Zetta Bills M.D.   On: 07/05/2022 13:52   Result Date: 07/05/2022 CLINICAL DATA:  A 66 year old male presents with RIGHT sided back pain flank pain since Thursday. EXAM: CT ANGIOGRAPHY CHEST WITH CONTRAST TECHNIQUE: Multidetector CT imaging of the chest was performed using the standard protocol during bolus  administration of intravenous contrast. Multiplanar CT image reconstructions and MIPs were obtained to evaluate the vascular anatomy. RADIATION DOSE REDUCTION: This exam was performed according to the departmental dose-optimization program which includes automated exposure control, adjustment of the mA and/or kV according to patient size and/or use of iterative reconstruction technique. CONTRAST:  77m OMNIPAQUE IOHEXOL 350 MG/ML SOLN COMPARISON:  None available FINDINGS: Cardiovascular: Aortic atherosclerotic plaque with calcification. Aorta shows smooth contours without aneurysmal dilation. Heart size mild to moderately enlarged without pericardial effusion or nodularity. Central pulmonary vasculature opacified to 324 Hounsfield units. Studies negative for pulmonary embolism. Mediastinum/Nodes: No thoracic inlet, axillary, mediastinal or hilar adenopathy. Esophagus grossly normal. There are signs of prior granulomatous disease with calcified lymph nodes in the mediastinum Lungs/Pleura: Mosaic attenuation throughout the chest. No lobar consolidative process. No pleural effusion or pneumothorax. Signs of prior granulomatous disease in the chest. Airways are patent. Upper Abdomen: Hepatic steatosis. Fissural widening of hepatic fissures. No acute process related to liver, visualized gallbladder, visualized pancreas, spleen, adrenal glands or gastrointestinal tract. Musculoskeletal: No acute bone finding. No destructive bone process. Spinal degenerative changes. Degenerative changes are moderate to marked in the visualized cervical and thoracic spine greatest at C6-7. Review of the MIP images confirms the above findings. IMPRESSION: 1. Negative for pulmonary embolism. 2. Mosaic attenuation throughout the chest, query air trapping and small airways disease. Correlate with any respiratory symptoms. 3. Signs of prior granulomatous disease in the chest. 4. Hepatic steatosis with widening of hepatic fissures, correlate  with any clinical or laboratory evidence of liver disease. 5. Degenerative changes of the spine most pronounced at C6-7 which is partially imaged. 6. Aortic atherosclerosis. Aortic Atherosclerosis (ICD10-I70.0). Electronically Signed: By: GZetta BillsM.D. On: 07/05/2022 13:32   CT HEAD WO CONTRAST (5MM)  Result Date: 07/05/2022 CLINICAL DATA:  Sudden severe headache. Patient reports a stent in right eye. EXAM: CT HEAD WITHOUT CONTRAST TECHNIQUE: Contiguous axial images were obtained from the base of the skull through the vertex without intravenous contrast. RADIATION DOSE REDUCTION: This exam was performed according to the departmental dose-optimization program which includes automated exposure control, adjustment of the mA and/or kV according to patient size and/or use of iterative reconstruction technique. COMPARISON:  None Available. FINDINGS: Brain: There is mild cortical atrophy, within normal limits for patient age. The ventricles are normal in configuration. The basilar cisterns are patent. No mass, mass effect, or midline shift. No acute intracranial hemorrhage is seen. No abnormal extra-axial fluid collection. Mild patchy subcortical white matter hypodensities are compatible with very mild chronic ischemic white matter changes. Preservation of the normal cortical gray-white interface without CT evidence of an acute major vascular territorial cortical based infarction. Vascular: No hyperdense vessel or unexpected calcification. Skull: Normal. Negative for fracture or focal lesion. Sinuses/Orbits: Postsurgical changes of right lens replacement. The orbital globes are normal in size and morphology. Minimal partially visualized peripheral inferior left maxillary sinus fibroid mucosal thickening. The mastoid air cells are clear. Other: None. IMPRESSION: No acute intracranial process. Electronically Signed  By: Yvonne Kendall M.D.   On: 07/05/2022 13:31     Radiology: CT Angio Chest/Abd/Pel for Dissection  W and/or Wo Contrast  Result Date: 07/05/2022 CLINICAL DATA:  Aortic syndrome suspected. Flank pain. Diaphoretic. EXAM: CT ANGIOGRAPHY CHEST, ABDOMEN AND PELVIS TECHNIQUE: Non-contrast CT of the chest was initially obtained. Multidetector CT imaging through the chest, abdomen and pelvis was performed using the standard protocol during bolus administration of intravenous contrast. Multiplanar reconstructed images and MIPs were obtained and reviewed to evaluate the vascular anatomy. RADIATION DOSE REDUCTION: This exam was performed according to the departmental dose-optimization program which includes automated exposure control, adjustment of the mA and/or kV according to patient size and/or use of iterative reconstruction technique. CONTRAST:  188m OMNIPAQUE IOHEXOL 350 MG/ML SOLN COMPARISON:  CT PA chest CT same day FINDINGS: CTA CHEST FINDINGS Cardiovascular: No aortic dissection or aneurysm. Ascending thoracic aorta measures 45 mm (sagittal image 89/series 10) no acute findings of the great vessels. Descending thoracic aorta normal. Contrast bolus opacifies the pulmonary arteries greater than the aorta. Evidence acute pulmonary embolism. No pericardial fluid. Mediastinum/Nodes: No axillary or supraclavicular adenopathy. No mediastinal or hilar adenopathy. No pericardial fluid. Esophagus normal. Lungs/Pleura: No pneumothorax or pleural fluid. Mild ground-glass densities calcified granuloma in the RIGHT lower lobe Musculoskeletal: No acute osseous abnormality. Review of the MIP images confirms the above findings. CTA ABDOMEN AND PELVIS FINDINGS VASCULAR Aorta: Normal caliber aorta without aneurysm, dissection, vasculitis or significant stenosis. Celiac: Patent without evidence of aneurysm, dissection, vasculitis or significant stenosis. SMA: Patent without evidence of aneurysm, dissection, vasculitis or significant stenosis. Renals: Both renal arteries are patent without evidence of aneurysm, dissection,  vasculitis, fibromuscular dysplasia or significant stenosis. IMA: Patent without evidence of aneurysm, dissection, vasculitis or significant stenosis. Inflow: Patent without evidence of aneurysm, dissection, vasculitis or significant stenosis. Veins: No obvious venous abnormality within the limitations of this arterial phase study. Review of the MIP images confirms the above findings. NON-VASCULAR Hepatobiliary: No focal hepatic lesion. No biliary duct dilatation. Common bile duct is normal. Pancreas: Pancreas is normal. No ductal dilatation. No pancreatic inflammation. Spleen: Normal spleen Adrenals/urinary tract: Adrenal glands and kidneys are normal. The ureters and bladder normal. Stomach/Bowel: Stomach, small bowel, appendix, and cecum are normal. Multiple diverticula of the descending colon and sigmoid colon without acute inflammation. Lymphatic: No lymphadenopathy Reproductive: Prostate hypertrophy. Other: No free fluid. Musculoskeletal: No aggressive osseous lesion. Review of the MIP images confirms the above findings. IMPRESSION: Chest Impression: 1. No acute aortic dissection or acute aneurysm. 2. Aneurysmal dilatation of the ascending thoracic aorta 45 mm. Ascending thoracic aortic aneurysm. Recommend semi-annual imaging followup by CTA or MRA and referral to cardiothoracic surgery if not already obtained. This recommendation follows 2010 ACCF/AHA/AATS/ACR/ASA/SCA/SCAI/SIR/STS/SVM Guidelines for the Diagnosis and Management of Patients With Thoracic Aortic Disease. Circulation. 2010; 121:: F810-F751 Aortic aneurysm NOS (ICD10-I71.9) Abdomen / Pelvis Impression: 1. No acute findings of the abdominal aorta or its branches. 2. LEFT colon diverticulosis without evidence diverticulitis Electronically Signed   By: SSuzy BouchardM.D.   On: 07/05/2022 14:17   CT Angio Chest PE W and/or Wo Contrast  Addendum Date: 07/05/2022   ADDENDUM REPORT: 07/05/2022 13:52 ADDENDUM: The aorta is dilated to 4.4 cm  greatest coronal dimension compatible with mild aneurysmal caliber of the ascending thoracic aorta. Recommend annual imaging followup by CTA or MRA. This recommendation follows 2010 ACCF/AHA/AATS/ACR/ASA/SCA/SCAI/SIR/STS/SVM Guidelines for the Diagnosis and Management of Patients with Thoracic Aortic Disease. Circulation. 2010; 121:: [id number] Aortic aneurysm NOS (ICD10-I71.9) These results  were called by telephone at the time of interpretation on 07/05/2022 at 1:52 pm to provider [facility] , who verbally acknowledged these results. Electronically Signed   By: Zetta Bills M.D.   On: 07/05/2022 13:52   Result Date: 07/05/2022 CLINICAL DATA:  A 66 year old male presents with RIGHT sided back pain flank pain since Thursday. EXAM: CT ANGIOGRAPHY CHEST WITH CONTRAST TECHNIQUE: Multidetector CT imaging of the chest was performed using the standard protocol during bolus administration of intravenous contrast. Multiplanar CT image reconstructions and MIPs were obtained to evaluate the vascular anatomy. RADIATION DOSE REDUCTION: This exam was performed according to the departmental dose-optimization program which includes automated exposure control, adjustment of the mA and/or kV according to patient size and/or use of iterative reconstruction technique. CONTRAST:  32m OMNIPAQUE IOHEXOL 350 MG/ML SOLN COMPARISON:  None available FINDINGS: Cardiovascular: Aortic atherosclerotic plaque with calcification. Aorta shows smooth contours without aneurysmal dilation. Heart size mild to moderately enlarged without pericardial effusion or nodularity. Central pulmonary vasculature opacified to 324 Hounsfield units. Studies negative for pulmonary embolism. Mediastinum/Nodes: No thoracic inlet, axillary, mediastinal or hilar adenopathy. Esophagus grossly normal. There are signs of prior granulomatous disease with calcified lymph nodes in the mediastinum Lungs/Pleura: Mosaic attenuation throughout the chest. No lobar consolidative  process. No pleural effusion or pneumothorax. Signs of prior granulomatous disease in the chest. Airways are patent. Upper Abdomen: Hepatic steatosis. Fissural widening of hepatic fissures. No acute process related to liver, visualized gallbladder, visualized pancreas, spleen, adrenal glands or gastrointestinal tract. Musculoskeletal: No acute bone finding. No destructive bone process. Spinal degenerative changes. Degenerative changes are moderate to marked in the visualized cervical and thoracic spine greatest at C6-7. Review of the MIP images confirms the above findings. IMPRESSION: 1. Negative for pulmonary embolism. 2. Mosaic attenuation throughout the chest, query air trapping and small airways disease. Correlate with any respiratory symptoms. 3. Signs of prior granulomatous disease in the chest. 4. Hepatic steatosis with widening of hepatic fissures, correlate with any clinical or laboratory evidence of liver disease. 5. Degenerative changes of the spine most pronounced at C6-7 which is partially imaged. 6. Aortic atherosclerosis. Aortic Atherosclerosis (ICD10-I70.0). Electronically Signed: By: GZetta BillsM.D. On: 07/05/2022 13:32   CT HEAD WO CONTRAST (5MM)  Result Date: 07/05/2022 CLINICAL DATA:  Sudden severe headache. Patient reports a stent in right eye. EXAM: CT HEAD WITHOUT CONTRAST TECHNIQUE: Contiguous axial images were obtained from the base of the skull through the vertex without intravenous contrast. RADIATION DOSE REDUCTION: This exam was performed according to the departmental dose-optimization program which includes automated exposure control, adjustment of the mA and/or kV according to patient size and/or use of iterative reconstruction technique. COMPARISON:  None Available. FINDINGS: Brain: There is mild cortical atrophy, within normal limits for patient age. The ventricles are normal in configuration. The basilar cisterns are patent. No mass, mass effect, or midline shift. No acute  intracranial hemorrhage is seen. No abnormal extra-axial fluid collection. Mild patchy subcortical white matter hypodensities are compatible with very mild chronic ischemic white matter changes. Preservation of the normal cortical gray-white interface without CT evidence of an acute major vascular territorial cortical based infarction. Vascular: No hyperdense vessel or unexpected calcification. Skull: Normal. Negative for fracture or focal lesion. Sinuses/Orbits: Postsurgical changes of right lens replacement. The orbital globes are normal in size and morphology. Minimal partially visualized peripheral inferior left maxillary sinus fibroid mucosal thickening. The mastoid air cells are clear. Other: None. IMPRESSION: No acute intracranial process. Electronically Signed   By:  Yvonne Kendall M.D.   On: 07/05/2022 13:31    ECHO no prior available for review  TELEMETRY reviewed by me (LT) 07/06/2022 : Atrial fibrillation rate 60s to 70s with occasional PVCs  EKG reviewed by me: atrial fibrillation with an incomplete RBBB rate of 67 without acute ST or T wave abnormalities.  Data reviewed by me (LT) 07/06/2022: ED note, admission H&P, [facility] note from 9/7, CBC, BMP, troponins, BNP, CTA chest abdomen pelvis, telemetry, EKG, last cardiology note  ASSESSMENT AND PLAN:  Kewan Mcnease is a 62yoM with a PMH of chronic atrial fibrillation not on anticoagulation (s/p unsuccessful DCCV years ago), hypertension, OSA noncompliant with CPAP, obesity who presented to [facility] ED 07/05/2022 from [facility] after a near syncopal episode.  Cardiology is consulted for further assistance.  #Near syncope #Chronic atrial fibrillation not on anticoagulation #Elevated troponin #Hypertension #Muscle strain The patient presents with presyncope in the setting of severe back pain while at an appointment with [facility] on 9/7.  He was standing and in severe back pain when he felt lightheaded and dizzy" like he was going to pass out"  followed by several seconds of decreased responsiveness, returning to his baseline quickly.  The symptoms have not reoccurred overnight in the ED, he has been ambulatory without dizziness or presyncope.  He has denied chest pain during the episode of lightheadedness and continues to deny this and overall felt well.  A troponin was checked and was minimally elevated at 117 with a flat trend at 100, not consistent with ACS in the absence of chest pain or EKG changes.  Suspect this episode was vasovagal in the setting of severe pain and troponin could be elevated as demand in that setting.  The patient has had chronic atrial fibrillation, followed closely by outpatient cardiology.  With shared decision making between the patient and his outpatient cardiologist, they have elected not to start anticoagulation due to his CHA2DS2-VASc of 2 (HTN, age) although we will continue to consider this in the future based on his age. -Agree with current therapy per primary team for treatment of his back/flank pain -Continuous monitoring on telemetry while inpatient -Continue 81 mg aspirin daily -Continue losartan 50 mg once daily -Echocardiogram complete -No further cardiac diagnostics necessary, he is okay for discharge from a cardiac standpoint today will need outpatient follow-up in 1 to 2 weeks, prefer seeing Dr. Nehemiah Massed in the [facility] office.  This patient's plan of care was discussed and created with Dr. Nehemiah Massed and he is in agreement.  Signed: Tristan Schroeder , PA-C 07/06/2022, 10:33 AM [facility]  The patient does have known chronic nonvalvular atrial fibrillation with anticoagulation hypertension hyperlipidemia sleep apnea for which the patient has been relatively stable.  He has had some nonspecific discomfort with no evidence of acute coronary syndrome congestive heart failure or myocardial infarction.  The patient feels well at this time with an echocardiogram showing normal LV systolic  function and no evidence of further concern.  Therefore the patient would be appropriate for discharged home with follow-up at a later date I have personally reviewed echocardiogram, EKG, telemetry, chest x-ray, lab work  The patient has been interviewed and examined. I agree with assessment and plan above. Serafina Royals MD [facility]

## 2022-12-22 ENCOUNTER — Emergency Department: Payer: Medicare PPO

## 2022-12-22 ENCOUNTER — Emergency Department
Admission: EM | Admit: 2022-12-22 | Discharge: 2022-12-22 | Disposition: A | Discharge status: Home / Self Care | Payer: Medicare PPO | Attending: Emergency Medicine | Admitting: Emergency Medicine

## 2022-12-22 DIAGNOSIS — R7989 Other specified abnormal findings of blood chemistry: Secondary | ICD-10-CM | POA: Insufficient documentation

## 2022-12-22 DIAGNOSIS — R55 Syncope and collapse: Secondary | ICD-10-CM | POA: Insufficient documentation

## 2022-12-22 DIAGNOSIS — I4891 Unspecified atrial fibrillation: Secondary | ICD-10-CM | POA: Insufficient documentation

## 2022-12-22 LAB — CBC
HCT: 43.2 % (ref 39.0–52.0)
Hemoglobin: 13.3 g/dL (ref 13.0–17.0)
MCH: 25.2 pg — ABNORMAL LOW (ref 26.0–34.0)
MCHC: 30.8 g/dL (ref 30.0–36.0)
MCV: 81.8 fL (ref 80.0–100.0)
Platelets: 275 10*3/uL (ref 150–400)
RBC: 5.28 MIL/uL (ref 4.22–5.81)
RDW: 19 % — ABNORMAL HIGH (ref 11.5–15.5)
WBC: 7.5 10*3/uL (ref 4.0–10.5)
nRBC: 0 % (ref 0.0–0.2)

## 2022-12-22 LAB — BASIC METABOLIC PANEL
Anion gap: 10 (ref 5–15)
BUN: 19 mg/dL (ref 8–23)
CO2: 21 mmol/L — ABNORMAL LOW (ref 22–32)
Calcium: 9.2 mg/dL (ref 8.9–10.3)
Chloride: 105 mmol/L (ref 98–111)
Creatinine, Ser: 1.45 mg/dL — ABNORMAL HIGH (ref 0.61–1.24)
GFR, Estimated: 52 mL/min — ABNORMAL LOW (ref >60)
Glucose, Bld: 118 mg/dL — ABNORMAL HIGH (ref 70–99)
Potassium: 4.5 mmol/L (ref 3.5–5.1)
Sodium: 136 mmol/L (ref 135–145)

## 2022-12-22 LAB — TROPONIN I (HIGH SENSITIVITY)
Troponin I (High Sensitivity): 85 ng/L — ABNORMAL HIGH (ref <18)
Troponin I (High Sensitivity): 80 ng/L — ABNORMAL HIGH (ref <18)

## 2022-12-22 LAB — MAGNESIUM: Magnesium: 2.3 mg/dL (ref 1.7–2.4)

## 2022-12-22 MED ORDER — SODIUM CHLORIDE 0.9 % IV BOLUS
500.0000 mL | Freq: Once | INTRAVENOUS | Status: AC
  Administered 2022-12-22: 500 mL via INTRAVENOUS

## 2022-12-22 MED ORDER — IOHEXOL 350 MG/ML SOLN
75.0000 mL | Freq: Once | INTRAVENOUS | Status: AC | PRN
  Administered 2022-12-22: 75 mL via INTRAVENOUS

## 2022-12-22 NOTE — Discharge Instructions (Addendum)
Your kidney levels were slightly high, a slightly elevated creatinine, which may be due to some mild dehydration.  Remember to stay hydrated by drinking plenty of fluids.  Avoid sugary beverages.  See your doctor to recheck this level within the next 1 to 2 weeks.

## 2022-12-22 NOTE — ED Triage Notes (Signed)
PT BIBA from scene where pt reports feeling hot and clammy then had a near syncopal episode. Denies LOC or hitting head. Pt A&Ox4.

## 2022-12-22 NOTE — ED Provider Notes (Signed)
[facility] Provider Note    Event Date/Time   First MD Initiated Contact with Patient 12/22/22 1500     (approximate)   History   Loss of Consciousness   HPI  Cody Golden is a 67 y.o. male   Past medical history of atrial fibrillation not on anticoagulation, arthritis, presents to the emergency department with presyncope.  He was at a friend's auto shop standing and talking when he felt flushed, nauseated, and sat down on a curb.  Denies loss of consciousness or any injuries.  His friends gave him some Sprite to drink after which his symptoms resolved.  He is no longer symptomatic.  He states he did not eat breakfast or lunch today.  He has no diabetes.  During this episode he denied chest pain, palpitations, shortness of breath.  He denies any recent illnesses.  He has otherwise been in his regular state of health, he has no other acute medical complaints.  He says that he has had a number of conversations with his cardiologist about anticoagulation and they have both elected for him to stay off of anticoagulation for the time being.  Independent Historian contributed to assessment above: Spouse at bedside  External Medical Documents Reviewed: Discharge summary from September 2023 for near syncope and chest pain found to have elevated troponin, medically managed likely demand ischemia from transient hypotension from near syncope, dilated aorta of 45 mm      Physical Exam   Triage Vital Signs: ED Triage Vitals [12/22/22 1503]  Enc Vitals Group     BP      Pulse      Resp      Temp      Temp src      SpO2 93 %     Weight      Height      Head Circumference      Peak Flow      Pain Score      Pain Loc      Pain Edu?      Excl. in [place]?     Most recent vital signs: Vitals:   12/22/22 1700 12/22/22 1730  BP: (!) 131/95 (!) 132/110  Pulse: 79 (!) 45  Resp: 15 13  Temp:    SpO2: 96% 97%    General: Awake, no distress.  CV:  Good  peripheral perfusion.  Resp:  Normal effort.  Abd:  No distention.  Other:  Awake alert comfortable appearing nontoxic with normal vital signs, no focal neurologic deficits including dysarthria, facial asymmetry, motor or sensory to all extremities, finger-nose is normal.  Heart sounds normal, regular, normal rate, lungs clear abdomen soft and nontender skin appears warm well-perfused mentation is normal.   ED Results / Procedures / Treatments   Labs (all labs ordered are listed, but only abnormal results are displayed) Labs Reviewed  BASIC METABOLIC PANEL - Abnormal; Notable for the following components:      Result Value   CO2 21 (*)    Glucose, Bld 118 (*)    Creatinine, Ser 1.45 (*)    GFR, Estimated 52 (*)    All other components within normal limits  CBC - Abnormal; Notable for the following components:   MCH 25.2 (*)    RDW 19.0 (*)    All other components within normal limits  TROPONIN I (HIGH SENSITIVITY) - Abnormal; Notable for the following components:   Troponin I (High Sensitivity) 85 (*)  All other components within normal limits  TROPONIN I (HIGH SENSITIVITY) - Abnormal; Notable for the following components:   Troponin I (High Sensitivity) 80 (*)    All other components within normal limits  MAGNESIUM  CBG MONITORING, ED     I ordered and reviewed the above labs they are notable for he has a normal white blood cell count and a hemoglobin of 13.3  EKG  ED ECG REPORT I, Lucillie Garfinkel, the attending physician, personally viewed and interpreted this ECG.   Date: 12/22/2022  EKG Time: 1509  Rate: 81  Rhythm: AF  Axis: nl  Intervals: Incomplete right bundle branch block  ST&T Change: No acute ischemic changes    RADIOLOGY I independently reviewed and interpreted CT angiogram of the chest and see no obvious filling defect to suggest PE   PROCEDURES:  Critical Care performed: No  Procedures   MEDICATIONS ORDERED IN ED: Medications  iohexol  (OMNIPAQUE) 350 MG/ML injection 75 mL (75 mLs Intravenous Contrast Given 12/22/22 1648)  sodium chloride 0.9 % bolus 500 mL (500 mLs Intravenous New Bag/Given 12/22/22 1732)     IMPRESSION / MDM / ASSESSMENT AND PLAN / ED COURSE  I reviewed the triage vital signs and the nursing notes.                                Patient's presentation is most consistent with acute presentation with potential threat to life or bodily function.  Differential diagnosis includes, but is not limited to, vasovagal syncope, dysrhythmia, electrolyte disturbance/hypoglycemia in the setting of not eating earlier, ACS, PE, stroke   The patient is on the cardiac monitor to evaluate for evidence of arrhythmia and/or significant heart rate changes.  MDM: Patient now asymptomatic with a transient presyncopal episode with flushing/warmth/nausea symptoms and no associated chest pain palpitations or shortness of breath.  He did not eat prior to this event.  He feels well now, consider vasovagal or hypoglycemia as most likely.  Consider dysrhythmia, PE, ACS, stroke.  Evaluate with EKG, serial troponins, CT angiogram.  I doubt stroke given no focal neurologic deficits and transient flushing sensation would be very atypical for CVA.  Trop at baseline patient has remained stable and CTA shows no signs of blood clot or other acute abnormalities, stable AAA, and his creatinine is slightly elevated from prior, will give some IV crystalloids and informed for him to stay hydrated.  I considered hospitalization for admission or observation since the patient has been asymptomatic in the emergency department and troponins at baseline I think she can be safe for discharge if the serial troponin remains at baseline and unchanged and he will follow-up with his cardiologist to discuss anticoagulation and PMD for follow-up recheck creatinine.  Troponins flat in the 80s, asymptomatic, I discussed treatment plan with patient and his wife and  they agree for discharge at this time with PMD follow-up and return with any new or worsening symptoms        FINAL CLINICAL IMPRESSION(S) / ED DIAGNOSES   Final diagnoses:  Elevated serum creatinine  Near syncope     Rx / DC Orders   ED Discharge Orders     None        Note:  This document was prepared using Dragon voice recognition software and may include unintentional dictation errors.    Lucillie Garfinkel, MD 12/22/22 [phone]

## 2022-12-22 NOTE — ED Notes (Signed)
Pt ambulated to the restroom with a steady gait.

## 2023-08-16 ENCOUNTER — Other Ambulatory Visit: Payer: Self-pay | Admitting: Student

## 2023-08-16 DIAGNOSIS — I7121 Aneurysm of the ascending aorta, without rupture: Secondary | ICD-10-CM

## 2023-08-20 ENCOUNTER — Ambulatory Visit: Payer: Medicare PPO

## 2023-08-23 ENCOUNTER — Ambulatory Visit: Admission: RE | Admit: 2023-08-23 | Payer: Medicare PPO | Source: Ambulatory Visit

## 2023-08-31 ENCOUNTER — Inpatient Hospital Stay
Admission: EM | Admit: 2023-08-31 | Discharge: 2023-09-02 | DRG: 281 | Disposition: A | Discharge status: Home / Self Care | Payer: Medicare PPO | Attending: Osteopathic Medicine | Admitting: Osteopathic Medicine

## 2023-08-31 ENCOUNTER — Emergency Department: Payer: Medicare PPO

## 2023-08-31 ENCOUNTER — Other Ambulatory Visit: Payer: Self-pay

## 2023-08-31 ENCOUNTER — Encounter: Payer: Self-pay | Admitting: Internal Medicine

## 2023-08-31 DIAGNOSIS — Z7952 Long term (current) use of systemic steroids: Secondary | ICD-10-CM | POA: Diagnosis not present

## 2023-08-31 DIAGNOSIS — M1990 Unspecified osteoarthritis, unspecified site: Secondary | ICD-10-CM | POA: Diagnosis present

## 2023-08-31 DIAGNOSIS — I214 Non-ST elevation (NSTEMI) myocardial infarction: Principal | ICD-10-CM | POA: Diagnosis present

## 2023-08-31 DIAGNOSIS — I10 Essential (primary) hypertension: Secondary | ICD-10-CM | POA: Diagnosis present

## 2023-08-31 DIAGNOSIS — I482 Chronic atrial fibrillation, unspecified: Secondary | ICD-10-CM | POA: Diagnosis present

## 2023-08-31 DIAGNOSIS — G4733 Obstructive sleep apnea (adult) (pediatric): Secondary | ICD-10-CM | POA: Diagnosis present

## 2023-08-31 DIAGNOSIS — Z7982 Long term (current) use of aspirin: Secondary | ICD-10-CM | POA: Diagnosis not present

## 2023-08-31 DIAGNOSIS — R072 Precordial pain: Secondary | ICD-10-CM | POA: Diagnosis present

## 2023-08-31 DIAGNOSIS — E7800 Pure hypercholesterolemia, unspecified: Secondary | ICD-10-CM | POA: Diagnosis present

## 2023-08-31 DIAGNOSIS — E66812 Obesity, class 2: Secondary | ICD-10-CM | POA: Diagnosis present

## 2023-08-31 DIAGNOSIS — I2510 Atherosclerotic heart disease of native coronary artery without angina pectoris: Secondary | ICD-10-CM | POA: Diagnosis present

## 2023-08-31 DIAGNOSIS — Z8616 Personal history of COVID-19: Secondary | ICD-10-CM

## 2023-08-31 DIAGNOSIS — Z79899 Other long term (current) drug therapy: Secondary | ICD-10-CM | POA: Diagnosis not present

## 2023-08-31 DIAGNOSIS — Z6837 Body mass index (BMI) 37.0-37.9, adult: Secondary | ICD-10-CM | POA: Diagnosis not present

## 2023-08-31 DIAGNOSIS — I4891 Unspecified atrial fibrillation: Secondary | ICD-10-CM | POA: Diagnosis present

## 2023-08-31 DIAGNOSIS — I7121 Aneurysm of the ascending aorta, without rupture: Secondary | ICD-10-CM | POA: Diagnosis present

## 2023-08-31 DIAGNOSIS — Z5309 Procedure and treatment not carried out because of other contraindication: Secondary | ICD-10-CM

## 2023-08-31 DIAGNOSIS — R079 Chest pain, unspecified: Principal | ICD-10-CM | POA: Diagnosis present

## 2023-08-31 LAB — BASIC METABOLIC PANEL
Anion gap: 8 (ref 5–15)
BUN: 14 mg/dL (ref 8–23)
CO2: 22 mmol/L (ref 22–32)
Calcium: 9.2 mg/dL (ref 8.9–10.3)
Chloride: 108 mmol/L (ref 98–111)
Creatinine, Ser: 1.11 mg/dL (ref 0.61–1.24)
GFR, Estimated: >60 mL/min (ref >60)
Glucose, Bld: 114 mg/dL — ABNORMAL HIGH (ref 70–99)
Potassium: 3.8 mmol/L (ref 3.5–5.1)
Sodium: 138 mmol/L (ref 135–145)

## 2023-08-31 LAB — CBC
HCT: 40 % (ref 39.0–52.0)
Hemoglobin: 12.9 g/dL — ABNORMAL LOW (ref 13.0–17.0)
MCH: 27.3 pg (ref 26.0–34.0)
MCHC: 32.3 g/dL (ref 30.0–36.0)
MCV: 84.6 fL (ref 80.0–100.0)
Platelets: 243 10*3/uL (ref 150–400)
RBC: 4.73 MIL/uL (ref 4.22–5.81)
RDW: 15.1 % (ref 11.5–15.5)
WBC: 5.5 10*3/uL (ref 4.0–10.5)
nRBC: 0 % (ref 0.0–0.2)

## 2023-08-31 LAB — TROPONIN I (HIGH SENSITIVITY)
Troponin I (High Sensitivity): 89 ng/L — ABNORMAL HIGH (ref <18)
Troponin I (High Sensitivity): 292 ng/L (ref <18)
Troponin I (High Sensitivity): 1636 ng/L (ref <18)

## 2023-08-31 LAB — APTT: aPTT: 26 s (ref 24–36)

## 2023-08-31 LAB — PROTIME-INR
INR: 1 (ref 0.8–1.2)
Prothrombin Time: 13.7 s (ref 11.4–15.2)

## 2023-08-31 MED ORDER — FLUTICASONE PROPIONATE 50 MCG/ACT NA SUSP: 1.0000 | Freq: Every day | NASAL | Status: DC | PRN

## 2023-08-31 MED ORDER — ACETAMINOPHEN 650 MG RE SUPP: 650.0000 mg | Freq: Four times a day (QID) | RECTAL | Status: DC | PRN

## 2023-08-31 MED ORDER — TIZANIDINE HCL 4 MG PO TABS: 4.0000 mg | ORAL_TABLET | Freq: Three times a day (TID) | ORAL | Status: DC | PRN

## 2023-08-31 MED ORDER — LOSARTAN POTASSIUM 50 MG PO TABS
50.0000 mg | ORAL_TABLET | Freq: Every day | ORAL | Status: DC
  Administered 2023-09-01: 50 mg via ORAL
  Filled 2023-08-31: qty 1

## 2023-08-31 MED ORDER — SENNOSIDES-DOCUSATE SODIUM 8.6-50 MG PO TABS: 1.0000 | ORAL_TABLET | Freq: Every evening | ORAL | Status: DC | PRN

## 2023-08-31 MED ORDER — ADULT MULTIVITAMIN W/MINERALS CH
1.0000 | ORAL_TABLET | Freq: Every day | ORAL | Status: DC
  Administered 2023-09-01: 1 via ORAL
  Filled 2023-08-31: qty 1

## 2023-08-31 MED ORDER — NITROGLYCERIN IN D5W 200-5 MCG/ML-% IV SOLN
0.0000 ug/min | INTRAVENOUS | Status: DC
  Administered 2023-08-31: 5 ug/min via INTRAVENOUS
  Administered 2023-09-01: 55 ug/min via INTRAVENOUS
  Administered 2023-09-02: 20 ug/min via INTRAVENOUS
  Filled 2023-08-31 (×3): qty 250

## 2023-08-31 MED ORDER — MORPHINE SULFATE (PF) 2 MG/ML IV SOLN: 2.0000 mg | INTRAVENOUS | Status: AC | PRN

## 2023-08-31 MED ORDER — ASPIRIN 81 MG PO CHEW
81.0000 mg | CHEWABLE_TABLET | ORAL | Status: AC
  Administered 2023-09-02: 81 mg via ORAL
  Filled 2023-08-31: qty 1

## 2023-08-31 MED ORDER — SODIUM CHLORIDE 0.9% FLUSH
3.0000 mL | Freq: Two times a day (BID) | INTRAVENOUS | Status: DC
Start: 2023-08-31 — End: 2023-09-02
  Administered 2023-08-31 – 2023-09-01 (×2): 3 mL via INTRAVENOUS

## 2023-08-31 MED ORDER — SODIUM CHLORIDE 0.9 % WEIGHT BASED INFUSION
1.0000 mL/kg/h | INTRAVENOUS | Status: DC
  Administered 2023-09-02: 1 mL/kg/h via INTRAVENOUS

## 2023-08-31 MED ORDER — SODIUM CHLORIDE 0.9% FLUSH: 3.0000 mL | INTRAVENOUS | Status: DC | PRN

## 2023-08-31 MED ORDER — SODIUM CHLORIDE 0.9 % IV SOLN
250.0000 mL | INTRAVENOUS | Status: AC | PRN
Start: 2023-08-31 — End: 2023-09-01

## 2023-08-31 MED ORDER — ONDANSETRON HCL 4 MG/2ML IJ SOLN
4.0000 mg | Freq: Four times a day (QID) | INTRAMUSCULAR | Status: DC | PRN
  Filled 2023-08-31: qty 2

## 2023-08-31 MED ORDER — HEPARIN (PORCINE) 25000 UT/250ML-% IV SOLN
1200.0000 [IU]/h | INTRAVENOUS | Status: DC
  Administered 2023-08-31 – 2023-09-01 (×2): 1200 [IU]/h via INTRAVENOUS
  Filled 2023-08-31 (×2): qty 250

## 2023-08-31 MED ORDER — ASPIRIN 81 MG PO CHEW
81.0000 mg | CHEWABLE_TABLET | Freq: Every day | ORAL | Status: DC
  Administered 2023-09-01: 81 mg via ORAL
  Filled 2023-08-31 (×2): qty 1

## 2023-08-31 MED ORDER — TIMOLOL MALEATE 0.5 % OP SOLN
1.0000 [drp] | Freq: Every morning | OPHTHALMIC | Status: DC
  Administered 2023-09-01 – 2023-09-02 (×2): 1 [drp] via OPHTHALMIC
  Filled 2023-08-31 (×2): qty 5

## 2023-08-31 MED ORDER — HYDRALAZINE HCL 20 MG/ML IJ SOLN: 5.0000 mg | Freq: Four times a day (QID) | INTRAMUSCULAR | Status: DC | PRN

## 2023-08-31 MED ORDER — ACETAMINOPHEN 325 MG PO TABS
650.0000 mg | ORAL_TABLET | Freq: Four times a day (QID) | ORAL | Status: DC | PRN
Start: 2023-08-31 — End: 2023-09-07
  Administered 2023-09-01: 650 mg via ORAL
  Filled 2023-08-31: qty 2

## 2023-08-31 MED ORDER — ONDANSETRON HCL 4 MG PO TABS: 4.0000 mg | ORAL_TABLET | Freq: Four times a day (QID) | ORAL | Status: DC | PRN

## 2023-08-31 MED ORDER — LATANOPROST 0.005 % OP SOLN
1.0000 [drp] | Freq: Every day | OPHTHALMIC | Status: DC
  Administered 2023-08-31 – 2023-09-01 (×2): 1 [drp] via OPHTHALMIC
  Filled 2023-08-31 (×2): qty 2.5

## 2023-08-31 MED ORDER — HEPARIN BOLUS VIA INFUSION
4000.0000 [IU] | Freq: Once | INTRAVENOUS | Status: AC
Start: 2023-08-31 — End: 2023-08-31
  Administered 2023-08-31: 4000 [IU] via INTRAVENOUS
  Filled 2023-08-31: qty 4000

## 2023-08-31 MED ORDER — COENZYME Q10 30 MG PO CAPS: 30.0000 mg | ORAL_CAPSULE | Freq: Every day | ORAL | Status: DC

## 2023-08-31 MED ORDER — SODIUM CHLORIDE 0.9 % WEIGHT BASED INFUSION
3.0000 mL/kg/h | INTRAVENOUS | Status: AC
Start: 2023-09-01 — End: 2023-09-01

## 2023-08-31 MED ORDER — IOHEXOL 350 MG/ML SOLN
75.0000 mL | Freq: Once | INTRAVENOUS | Status: AC | PRN
  Administered 2023-08-31: 75 mL via INTRAVENOUS

## 2023-08-31 NOTE — ED Notes (Signed)
Nitropaste removed from chest.

## 2023-08-31 NOTE — Consult Note (Signed)
CARDIOLOGY CONSULT NOTE               Patient ID: Cody Golden MRN: [id number] DOB/AGE: [birth year] 67 y.o.  Admit date: 08/31/2023 Referring Physician Dr. Londell Moh hospitalist Primary Physician Dr. [name] primary Primary Cardiologist [facility] Reason for Consultation unstable angina ACS possible non-STEMI  HPI: Patient is a 67 year old male present with history of hypertension atrial fibrillation obesity hyperlipidemia history of ascending aortic aneurysm without rupture sleep apnea presented with chest pain symptoms worrisome for angina troponins elevated so cardiology consultation was recommended for possible non-STEMI.  Patient had midsternal chest pain sharp radiating down the left arm and elbow denies shortness of breath no swelling does not smoke no blackout spells or syncope the patient was then finally referred for further evaluation  Review of systems complete and found to be negative unless listed above     Past Medical History:  Diagnosis Date   Arthritis    Atrial fibrillation (HCC)    chronic   COVID-19 11/02/2019   Dysrhythmia    A-fib    Past Surgical History:  Procedure Laterality Date   CARDIOVERSION     x3 done years ago without conversion   CATARACT EXTRACTION W/PHACO Right 11/28/2020   Procedure: CATARACT EXTRACTION PHACO AND INTRAOCULAR LENS PLACEMENT (IOC) RIGHT ISTENT INJ 1.80 00:16.1;  Surgeon: Nevada Crane, MD;  Location: [facility];  Service: Ophthalmology;  Laterality: Right;  covid + 11-16-20   COLONOSCOPY WITH PROPOFOL N/A 03/09/2022   Procedure: COLONOSCOPY WITH PROPOFOL;  Surgeon: Regis Bill, MD;  Location: [facility];  Service: Endoscopy;  Laterality: N/A;   SHOULDER ARTHROSCOPY     WEIL OSTEOTOMY Right 03/24/2015   Procedure: 2ND -3RD METARSAL WEIL OSTEOTOMY,  PLANTAR PLATE REPAIR ;  Surgeon: Toni Arthurs, MD;  Location: [facility];  Service: Orthopedics;  Laterality: Right;    (Not  in a hospital admission)  Social History   Socioeconomic History   Marital status: Married    Spouse name: Not on file   Number of children: Not on file   Years of education: Not on file   Highest education level: Not on file  Occupational History   Not on file  Tobacco Use   Smoking status: Never   Smokeless tobacco: Never  Vaping Use   Vaping status: Never Used  Substance and Sexual Activity   Alcohol use: No   Drug use: No   Sexual activity: Not Currently  Other Topics Concern   Not on file  Social History Narrative   Not on file   Social Determinants of Health   Financial Resource Strain: Low Risk  (06/03/2023)   Received from [facility]   Overall Financial Resource Strain (CARDIA)    Difficulty of Paying Living Expenses: Not very hard  Food Insecurity: No Food Insecurity (06/03/2023)   Received from [facility]   Hunger Vital Sign    Worried About Running Out of Food in the Last Year: Never true    Ran Out of Food in the Last Year: Never true  Transportation Needs: No Transportation Needs (06/03/2023)   Received from [facility] - Transportation    In the past 12 months, has lack of transportation kept you from medical appointments or from getting medications?: No    Lack of Transportation (Non-Medical): No  Physical Activity: Not on file  Stress: Not on file  Social Connections: Not on file  Intimate Partner Violence: Not At Risk (07/06/2022)   Humiliation, Afraid, Rape, and Kick questionnaire    Fear of Current or Ex-Partner: No    Emotionally Abused: No    Physically Abused: No    Sexually Abused: No    Family History  Adopted: Yes  Problem Relation Age of Onset   Alcohol abuse Father       Review of systems complete and found to be negative unless listed above      PHYSICAL EXAM  General: Well developed, well nourished, in no acute distress HEENT:  Normocephalic and atramatic Neck:   No JVD.  Lungs: Clear bilaterally to auscultation and percussion. Heart: Irregular irregular. Normal S1 and S2 without gallops or murmurs.  Abdomen: Bowel sounds are positive, abdomen soft and non-tender  Msk:  Back normal, normal gait. Normal strength and tone for age. Extremities: No clubbing, cyanosis or edema.   Neuro: Alert and oriented X 3. Psych:  Good affect, responds appropriately  Labs:   Lab Results  Component Value Date   WBC 5.5 08/31/2023   HGB 12.9 (L) 08/31/2023   HCT 40.0 08/31/2023   MCV 84.6 08/31/2023   PLT 243 08/31/2023    Recent Labs  Lab 08/31/23 1240  NA 138  K 3.8  CL 108  CO2 22  BUN 14  CREATININE 1.11  CALCIUM 9.2  GLUCOSE 114*   No results found for: "CKTOTAL", "CKMB", "CKMBINDEX", "TROPONINI" No results found for: "CHOL" No results found for: "HDL" No results found for: "LDLCALC" No results found for: "TRIG" No results found for: "CHOLHDL" No results found for: "LDLDIRECT"    Radiology: CT Angio Chest Aorta W and/or Wo Contrast  Result Date: 08/31/2023 CLINICAL DATA:  Chest pain radiating to the left arm. Acute aortic syndrome suspected. Ascending aortic aneurysm. EXAM: CT ANGIOGRAPHY CHEST WITH CONTRAST TECHNIQUE: Multidetector CT imaging of the chest was performed using the standard protocol during bolus administration of intravenous contrast. Multiplanar CT image reconstructions and MIPs were obtained to evaluate the vascular anatomy. RADIATION DOSE REDUCTION: This exam was performed according to the departmental dose-optimization program which includes automated exposure control, adjustment of the mA and/or kV according to patient size and/or use of iterative reconstruction technique. CONTRAST:  75mL OMNIPAQUE IOHEXOL 350 MG/ML SOLN COMPARISON:  Chest radiograph dated 08/31/2023 and CT dated 12/22/2022. FINDINGS: Cardiovascular: There is no cardiomegaly or pericardial effusion. There is coronary vascular calcification of the LAD. Dilated  ascending aorta measures up to 4.6 cm in diameter. No aortic dissection. The descending thoracic aorta is tortuous. There is mild atherosclerotic calcification of the thoracic aorta. No periaortic fluid collection. The origins of the great vessels of the aortic arch appear patent. Mediastinum/Nodes: No hilar or mediastinal adenopathy. Calcified granuloma. The esophagus is grossly unremarkable. No mediastinal fluid collection. Lungs/Pleura: Several scattered calcified granuloma. No focal consolidation, pleural effusion, or pneumothorax. The central airways are patent. Upper Abdomen: No acute abnormality. Musculoskeletal: Osteopenia with degenerative changes of spine. No acute osseous pathology. Review of the MIP images confirms the above findings. IMPRESSION: 1. No acute intrathoracic pathology.  No aortic dissection. 2. Dilated ascending aorta measures up to 4.6 cm in diameter. Ascending thoracic aortic aneurysm. Recommend semi-annual imaging followup by CTA or MRA and referral to cardiothoracic surgery if not already obtained. This recommendation follows 2010 ACCF/AHA/AATS/ACR/ASA/SCA/SCAI/SIR/STS/SVM Guidelines for the Diagnosis and Management of Patients With Thoracic Aortic Disease. Circulation. 2010; 121: Z610-R604. Aortic aneurysm NOS (ICD10-I71.9). 3.  Aortic Atherosclerosis (ICD10-I70.0). Electronically Signed   By: Burtis Junes  Radparvar M.D.   On: 08/31/2023 16:14   DG Chest 2 View  Result Date: 08/31/2023 CLINICAL DATA:  Chest pain EXAM: CHEST - 2 VIEW COMPARISON:  X-ray 03/18/2007 FINDINGS: Calcified right lung base nodule consistent with old granulomatous disease and unchanged. No consolidation, pneumothorax or effusion. No edema. Normal cardiopericardial silhouette. Tortuous ectatic aorta. Surgical changes along the right humeral head at the edge of the imaging field. Degenerative changes of the spine. Air-fluid level along the stomach beneath the left hemidiaphragm. IMPRESSION: No acute cardiopulmonary  disease. Electronically Signed   By: Karen Kays M.D.   On: 08/31/2023 13:25    EKG: Atrial fibrillation rate controlled around 85 nonspecific CT changes  ASSESSMENT AND PLAN:  Non-STEMI Coronary artery disease Obesity Chest pain Hypertension Atrial fibrillation  Plan Agree with admit to telemetry follow-up EKGs troponins Echocardiogram for assessment left ventricular function wall motion and valvular structures Recommend anticoagulation with heparin for cardiac cath Continue blood pressure management and control Recommend cardiac cath prior to discharge Continue obstructive apnea management Consider the long-term anticoagulation because of atrial fibrillation [place] score of 1 now with probable coronary disease may be 2 and anticoagulation should be strongly considered   Signed: Alwyn Pea MD 08/31/2023, 6:24 PM

## 2023-08-31 NOTE — ED Notes (Signed)
Pt to CT

## 2023-08-31 NOTE — Assessment & Plan Note (Addendum)
Heparin GTT per pharmacy continued on admission Nitroglycerin infusion continued on admission EDP consulted Dr. Juliann Pares from [facility] who states he will see the patient Epic order placed for [facility] clinic cardiology consultation Admit to telemetry cardiac, inpatient

## 2023-08-31 NOTE — Assessment & Plan Note (Addendum)
Suspect secondary to NSTEMI, treat per above Continue nitroglycerin Symptomatic support: Morphine 2 mg IV every 4 hours as needed for moderate, severe pain, 20 hours of coverage ordered

## 2023-08-31 NOTE — ED Provider Notes (Signed)
[facility] Provider Note    Event Date/Time   First MD Initiated Contact with Patient 08/31/23 1357     (approximate)   History   Chest pain  HPI  Cody Golden is a 67 y.o. male with history of atrial fibrillation who presents with complaints of chest pain.  Patient reports that started around 10 AM this morning, he notes the pain was initially sharper with brief episodes of pain traveling to his left arm.  Now it is more dull and central and mild to moderate.  He did take a baby aspirin.  He does not take any blood thinners for atrial fibrillation.  Does follow with [facility] cardiology.  No fevers or chills.  Reports a history of an aortic aneurysm     Physical Exam   Triage Vital Signs: ED Triage Vitals  Encounter Vitals Group     BP 08/31/23 1238 (!) 177/109     Systolic BP Percentile --      Diastolic BP Percentile --      Pulse Rate 08/31/23 1236 66     Resp 08/31/23 1236 18     Temp 08/31/23 1236 98 F (36.7 C)     Temp Source 08/31/23 1236 Oral     SpO2 08/31/23 1236 99 %     Weight 08/31/23 1237 117 kg (257 lb 15 oz)     Height 08/31/23 1237 1.778 m (5\' 10" )     Head Circumference --      Peak Flow --      Pain Score 08/31/23 1237 6     Pain Loc --      Pain Education --      Exclude from Growth Chart --     Most recent vital signs: Vitals:   08/31/23 1236 08/31/23 1238  BP:  (!) 177/109  Pulse: 66   Resp: 18   Temp: 98 F (36.7 C)   SpO2: 99%      General: Awake, no distress.  CV:  Good peripheral perfusion.  Regular rate and rhythm Resp:  Normal effort.  Clear to auscultation bilaterally Abd:  No distention.  Other:  No lower extremity swelling or tenderness   ED Results / Procedures / Treatments   Labs (all labs ordered are listed, but only abnormal results are displayed) Labs Reviewed  BASIC METABOLIC PANEL - Abnormal; Notable for the following components:      Result Value   Glucose, Bld 114 (*)    All  other components within normal limits  CBC - Abnormal; Notable for the following components:   Hemoglobin 12.9 (*)    All other components within normal limits  TROPONIN I (HIGH SENSITIVITY) - Abnormal; Notable for the following components:   Troponin I (High Sensitivity) 89 (*)    All other components within normal limits  TROPONIN I (HIGH SENSITIVITY)     EKG  ED ECG REPORT I, Jene Every, the attending physician, personally viewed and interpreted this ECG.  Date: 08/31/2023  Rhythm: Atrial fibrillation QRS Axis: normal Intervals: AB normal ST/T Wave abnormalities: normal Narrative Interpretation: no evidence of acute ischemia    RADIOLOGY Chest x-ray viewed interpret by me, no acute abnormality    PROCEDURES:  Critical Care performed:   Procedures   MEDICATIONS ORDERED IN ED: Medications - No data to display   IMPRESSION / MDM / ASSESSMENT AND PLAN / ED COURSE  I reviewed the triage vital signs and the nursing notes. Patient's presentation is  most consistent with acute presentation with potential threat to life or bodily function.  Patient presents with chest pain as detailed above, differential includes ACS, angina, given history aortic aneurysm/dissection is a possibility  Overall well-appearing with improving pain, EKG is reassuring, high sensitive troponin is elevated however he has chronically elevated troponin and this appears to be in line with prior levels.  Otherwise blood work is reassuring, chest x-ray without evidence of pneumonia or pneumothorax  Have ordered CT angiography, second troponin pending, will asked my colleague to follow-up on these results and reevaluate        FINAL CLINICAL IMPRESSION(S) / ED DIAGNOSES   Final diagnoses:  Chest pain, unspecified type     Rx / DC Orders   ED Discharge Orders     None        Note:  This document was prepared using Dragon voice recognition software and may include unintentional  dictation errors.   Jene Every, MD 08/31/23 1450

## 2023-08-31 NOTE — ED Triage Notes (Signed)
Pt here via [facility] with cp radiating to the left arm. Pt has hx of a fib, pt took 325 mg asa before ems arrival. 1 mg of nitroglycerin paste applied at 1150. Pt does not have cardiac hx, 6/10 pain.   180/110 86 97% RA 133-cbg

## 2023-08-31 NOTE — Hospital Course (Signed)
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  Vitals in the ED showed temperature of 98, respiration rate of 18, heart rate of 66, blood pressure 177/109, SpO2 99% on room air.  Serum sodium is 138, potassium 2.8, chloride 108, bicarb 22, BUN of 14, serum creatinine 1.11, EGFR greater than 60, nonfasting blood glucose 114, WBC 5.5, hemoglobin 12.9, platelets of 243.  High since troponin is 89 and on repeat was 292.  ED treatment: Heparin GTT, nitroglycerin infusion.

## 2023-08-31 NOTE — H&P (Addendum)
History and Physical   Cody Golden:[id number] DOB: 1956/07/06 DOA: 08/31/2023  PCP: Cody Rud, MD Outpatient Specialists: [facility] clinic cardiology, previously saw Dr. Lady Gary Patient coming from: Home via EMS  I have personally briefly reviewed patient's old medical records in [facility] EMR.  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  Vitals in the ED showed temperature of 98, respiration rate of 18, heart rate of 66, blood pressure 177/109, SpO2 99% on room air.  Serum sodium is 138, potassium 2.8, chloride 108, bicarb 22, BUN of 14, serum creatinine 1.11, EGFR greater than 60, nonfasting blood glucose 114, WBC 5.5, hemoglobin 12.9, platelets of 243.  High since troponin is 89 and on repeat was 292.  ED treatment: Heparin GTT, nitroglycerin infusion. -------------------------------- At bedside, patient was able to tell me his name, age, location, current calendar year.  He reports that this morning while he was cleaning his parrot's birdcage, he developed sharp chest pain that radiated down to his left elbow.  The upper extremity pain then resolved and then the pain became a dull aching pain.  He reports the pain is substernal and towards the left side of his chest.  He denies trauma to his person.  He reports he has never had pain like this before.  He denies shortness of breath, dysuria, hematuria, diarrhea, swelling of his lower extremity, syncope, loss of consciousness.  He denies nausea or vomiting.  At that side, he reports the chest pain has resolved after they initiated the nitroglycerin drip.  Social history: He lives with his wife.  He denies tobacco, EtOH, recreational drug use.  He  currently works as a Administrator, [employer]  ROS: Constitutional: no weight change, no fever ENT/Mouth: no sore throat, no rhinorrhea Eyes: no eye pain, no vision changes Cardiovascular: + chest pain, no dyspnea,  no edema, no palpitations Respiratory: no cough, no sputum, no wheezing Gastrointestinal: no nausea, no vomiting, no diarrhea, no constipation Genitourinary: no urinary incontinence, no dysuria, no hematuria Musculoskeletal: no arthralgias, no myalgias Skin: no skin lesions, no pruritus, Neuro: no weakness, no loss of consciousness, no syncope Psych: no anxiety, no depression, no decrease appetite Heme/Lymph: no bruising, no bleeding  ED Course: Discussed with the EDP, patient requiring hospitalization for chief concerns of NSTEMI.  Assessment/Plan  Principal Problem:   NSTEMI (non-ST elevated myocardial infarction) (HCC) Active Problems:   Chest pain   Essential hypertension   OSA (obstructive sleep apnea)   Atrial fibrillation (HCC)   MRI contraindicated due to metal implant   Obesity, Class II, BMI 35-39.9   Assessment and Plan:  * NSTEMI (non-ST elevated myocardial infarction) (HCC) Heparin GTT per pharmacy continued on admission Nitroglycerin infusion continued on admission EDP consulted Dr. Juliann Pares from [facility] who states he will see the patient Epic order placed for [facility] clinic cardiology consultation Admit to telemetry cardiac, inpatient  Obesity, Class II, BMI 35-39.9 This meets criteria for morbid obesity based on the presence of 1 or more chronic comorbidities. Patient has 37 and htn. This complicates overall care and prognosis.   OSA (obstructive sleep apnea) CPAP was usually ordered, patient declined states that he no longer needed CPAP   Essential hypertension Home losartan 50 mg daily resumed on admission  Chest pain Suspect secondary to NSTEMI,  treat per above Continue nitroglycerin Symptomatic support: Morphine 2 mg IV every 4  hours as needed for moderate, severe pain, 20 hours of coverage ordered  Chart reviewed.   DVT prophylaxis: Heparin GTT per pharmacy Code Status: Full code Diet: Heart healthy on admission; n.p.o. after midnight Family Communication: Updated his spouse, Dois Golden at bedside with patient's permission Disposition Plan: Pending cardiology evaluation and clinical course Consults called: [facility] clinic cardiology Admission status: Telemetry cardiac, inpatient  Past Medical History:  Diagnosis Date   Arthritis    Atrial fibrillation (HCC)    chronic   COVID-19 11/02/2019   Dysrhythmia    A-fib   Past Surgical History:  Procedure Laterality Date   CARDIOVERSION     x3 done years ago without conversion   CATARACT EXTRACTION W/PHACO Right 11/28/2020   Procedure: CATARACT EXTRACTION PHACO AND INTRAOCULAR LENS PLACEMENT (IOC) RIGHT ISTENT INJ 1.80 00:16.1;  Surgeon: Nevada Crane, MD;  Location: [facility];  Service: Ophthalmology;  Laterality: Right;  covid + 11-16-20   COLONOSCOPY WITH PROPOFOL N/A 03/09/2022   Procedure: COLONOSCOPY WITH PROPOFOL;  Surgeon: Regis Bill, MD;  Location: [facility];  Service: Endoscopy;  Laterality: N/A;   SHOULDER ARTHROSCOPY     WEIL OSTEOTOMY Right 03/24/2015   Procedure: 2ND -3RD METARSAL WEIL OSTEOTOMY,  PLANTAR PLATE REPAIR ;  Surgeon: Toni Arthurs, MD;  Location: [facility];  Service: Orthopedics;  Laterality: Right;   Social History:  reports that he has never smoked. He has never used smokeless tobacco. He reports that he does not drink alcohol and does not use drugs.  No Known Allergies Family History  Adopted: Yes  Problem Relation Age of Onset   Alcohol abuse Father    Family history: Family history reviewed and not pertinent.  Prior to Admission medications   Medication Sig Start Date End Date Taking? Authorizing Provider  aspirin 81 MG chewable tablet Chew 81 mg by mouth daily.   Yes [provider]  co-enzyme Q-10 30 MG capsule Take 30 mg by mouth daily.   Yes [provider]  fluticasone (FLONASE) 50 MCG/ACT nasal spray Place into the nose. 12/07/22 12/07/23 Yes [provider]  latanoprost (XALATAN) 0.005 % ophthalmic solution Place 1 drop into both eyes at bedtime.   Yes [provider]  losartan (COZAAR) 50 MG tablet Take 50 mg by mouth daily. 06/01/22  Yes [provider]  Multiple Vitamin (MULTIVITAMIN) tablet Take 1 tablet by mouth daily.   Yes [provider]  predniSONE (DELTASONE) 10 MG tablet Take 1 tablet by mouth daily. 12/11/22  Yes [provider]  timolol (TIMOPTIC) 0.5 % ophthalmic solution Place 1 drop into both eyes every morning. 06/03/22  Yes [provider]  tiZANidine (ZANAFLEX) 4 MG tablet Take 4 mg by mouth every 8 (eight) hours. 06/29/22  Yes [provider]  cyclobenzaprine (FLEXERIL) 5 MG tablet TAKE 1 TABLET BY MOUTH THREE TIMES DAILY FOR 5 DAYS Patient not taking: Reported on 12/22/2022    [provider]  traMADol (ULTRAM) 50 MG tablet TAKE 1 TABLET BY MOUTH EVERY 8 HOURS FOR 5 DAYS AS NEEDED Patient not taking: Reported on 12/22/2022    [provider]   Physical Exam: Vitals:   08/31/23 1648 08/31/23 1651 08/31/23 1654 08/31/23 1734  BP: (!) 155/109 (!) 150/102 (!) 154/99   Pulse:      Resp:      Temp:    98 F (36.7 C)  TempSrc:  Oral  SpO2:      Weight:      Height:       Constitutional: appears age-appropriate, NAD, calm Eyes: PERRL, lids and conjunctivae normal ENMT: Mucous membranes are moist. Posterior pharynx clear of any exudate or lesions. Age-appropriate dentition. Hearing appropriate. Neck: normal, supple, no masses, no thyromegaly Respiratory: clear to auscultation bilaterally, no wheezing, no crackles. Normal respiratory effort. No accessory muscle use.  Cardiovascular: irregular rhythm, no murmurs / rubs / gallops. No extremity edema. 2+  pedal pulses. No carotid bruits.  Abdomen: Obese abdomen, no tenderness, no masses palpated, no hepatosplenomegaly. Bowel sounds positive.  Musculoskeletal: no clubbing / cyanosis. No joint deformity upper and lower extremities. Good ROM, no contractures, no atrophy. Normal muscle tone.  Skin: no rashes, lesions, ulcers. No induration Neurologic: Sensation intact. Strength 5/5 in all 4.  Psychiatric: Normal judgment and insight. Alert and oriented x 3. Normal mood.   EKG: independently reviewed, showing atrial fibrillation with rate of 87, QTc 430  Chest x-ray on Admission: I personally reviewed and I agree with radiologist reading as below.  CT Angio Chest Aorta W and/or Wo Contrast  Result Date: 08/31/2023 CLINICAL DATA:  Chest pain radiating to the left arm. Acute aortic syndrome suspected. Ascending aortic aneurysm. EXAM: CT ANGIOGRAPHY CHEST WITH CONTRAST TECHNIQUE: Multidetector CT imaging of the chest was performed using the standard protocol during bolus administration of intravenous contrast. Multiplanar CT image reconstructions and MIPs were obtained to evaluate the vascular anatomy. RADIATION DOSE REDUCTION: This exam was performed according to the departmental dose-optimization program which includes automated exposure control, adjustment of the mA and/or kV according to patient size and/or use of iterative reconstruction technique. CONTRAST:  75mL OMNIPAQUE IOHEXOL 350 MG/ML SOLN COMPARISON:  Chest radiograph dated 08/31/2023 and CT dated 12/22/2022. FINDINGS: Cardiovascular: There is no cardiomegaly or pericardial effusion. There is coronary vascular calcification of the LAD. Dilated ascending aorta measures up to 4.6 cm in diameter. No aortic dissection. The descending thoracic aorta is tortuous. There is mild atherosclerotic calcification of the thoracic aorta. No periaortic fluid collection. The origins of the great vessels of the aortic arch appear patent. Mediastinum/Nodes: No hilar  or mediastinal adenopathy. Calcified granuloma. The esophagus is grossly unremarkable. No mediastinal fluid collection. Lungs/Pleura: Several scattered calcified granuloma. No focal consolidation, pleural effusion, or pneumothorax. The central airways are patent. Upper Abdomen: No acute abnormality. Musculoskeletal: Osteopenia with degenerative changes of spine. No acute osseous pathology. Review of the MIP images confirms the above findings. IMPRESSION: 1. No acute intrathoracic pathology.  No aortic dissection. 2. Dilated ascending aorta measures up to 4.6 cm in diameter. Ascending thoracic aortic aneurysm. Recommend semi-annual imaging followup by CTA or MRA and referral to cardiothoracic surgery if not already obtained. This recommendation follows 2010 ACCF/AHA/AATS/ACR/ASA/SCA/SCAI/SIR/STS/SVM Guidelines for the Diagnosis and Management of Patients With Thoracic Aortic Disease. Circulation. 2010; 121: U045-W098. Aortic aneurysm NOS (ICD10-I71.9). 3.  Aortic Atherosclerosis (ICD10-I70.0). Electronically Signed   By: Elgie Collard M.D.   On: 08/31/2023 16:14   DG Chest 2 View  Result Date: 08/31/2023 CLINICAL DATA:  Chest pain EXAM: CHEST - 2 VIEW COMPARISON:  X-ray 03/18/2007 FINDINGS: Calcified right lung base nodule consistent with old granulomatous disease and unchanged. No consolidation, pneumothorax or effusion. No edema. Normal cardiopericardial silhouette. Tortuous ectatic aorta. Surgical changes along the right humeral head at the edge of the imaging field. Degenerative changes of the spine. Air-fluid level along the stomach beneath the left hemidiaphragm. IMPRESSION: No acute cardiopulmonary disease. Electronically Signed  By: Karen Kays M.D.   On: 08/31/2023 13:25    Labs on Admission: I have personally reviewed following labs CBC: Recent Labs  Lab 08/31/23 1240  WBC 5.5  HGB 12.9*  HCT 40.0  MCV 84.6  PLT 243   Basic Metabolic Panel: Recent Labs  Lab 08/31/23 1240  NA 138   K 3.8  CL 108  CO2 22  GLUCOSE 114*  BUN 14  CREATININE 1.11  CALCIUM 9.2   GFR: Estimated Creatinine Clearance: 80.5 mL/min (by C-G formula based on SCr of 1.11 mg/dL).  Urine analysis:    Component Value Date/Time   COLORURINE YELLOW (A) 07/05/2022 0934   APPEARANCEUR HAZY (A) 07/05/2022 0934   APPEARANCEUR Hazy 08/18/2012 1420   LABSPEC 1.027 07/05/2022 0934   LABSPEC 1.020 08/18/2012 1420   PHURINE 5.0 07/05/2022 0934   GLUCOSEU NEGATIVE 07/05/2022 0934   GLUCOSEU Negative 08/18/2012 1420   HGBUR NEGATIVE 07/05/2022 0934   BILIRUBINUR NEGATIVE 07/05/2022 0934   BILIRUBINUR negative 05/20/2020 1359   BILIRUBINUR Negative 08/18/2012 1420   KETONESUR NEGATIVE 07/05/2022 0934   PROTEINUR 30 (A) 07/05/2022 0934   UROBILINOGEN 0.2 05/20/2020 1359   NITRITE NEGATIVE 07/05/2022 0934   LEUKOCYTESUR NEGATIVE 07/05/2022 0934   LEUKOCYTESUR Negative 08/18/2012 1420   This document was prepared using Dragon Voice Recognition software and may include unintentional dictation errors.  Dr. Sedalia Muta [facility]  If 7PM-7AM, please contact overnight-coverage provider If 7AM-7PM, please contact day attending provider [email or web address]  08/31/2023, 5:44 PM

## 2023-08-31 NOTE — Consult Note (Signed)
PHARMACY - ANTICOAGULATION CONSULT NOTE  Pharmacy Consult for Heparin Infusion Indication: chest pain/ACS  No Known Allergies  Patient Measurements: Height: 5\' 10"  (177.8 cm) Weight: 117 kg (257 lb 15 oz) IBW/kg (Calculated) : 73 Heparin Dosing Weight: 99 kg  Vital Signs: Temp: 98 F (36.7 C) (11/02 1236) Temp Source: Oral (11/02 1236) BP: 169/104 (11/02 1636) Pulse Rate: 66 (11/02 1236)  Labs: Recent Labs    08/31/23 1240 08/31/23 1438  HGB 12.9*  --   HCT 40.0  --   PLT 243  --   CREATININE 1.11  --   TROPONINIHS 89* 292*    Estimated Creatinine Clearance: 80.5 mL/min (by C-G formula based on SCr of 1.11 mg/dL).   Medical History: Past Medical History:  Diagnosis Date   Arthritis    Atrial fibrillation (HCC)    chronic   COVID-19 11/02/2019   Dysrhythmia    A-fib   Assessment: CHRISTIANJAMES SOULE is a 67 y.o. male presenting with chest pain. PMH significant for HTN, OSA, AF. Patient was not on [facility] PTA per chart review. Pharmacy has been consulted to initiate and manage heparin infusion.   Baseline Labs: aPTT 26, PT 13.7, INR 1.0, Hgb 12.9, Hct 40.0, Plt 243   Goal of Therapy:  Heparin level 0.3-0.7 units/ml Monitor platelets by anticoagulation protocol: Yes   Plan:  Give 4000 units bolus x 1 Start heparin infusion at 1200 units/hr Check HL in 6 hours  Continue to monitor H&H and platelets daily while on heparin infusion   Celene Squibb, PharmD Clinical Pharmacist 08/31/2023 4:41 PM

## 2023-08-31 NOTE — Progress Notes (Signed)
PHARMACIST - PHYSICIAN ORDER COMMUNICATION  CONCERNING: P&T Medication Policy on Herbal Medications  DESCRIPTION:  This patient's order for:  co-enyze Q-10 30 mg  has been noted.  This product(s) is classified as an "herbal" or natural product. Due to a lack of definitive safety studies or FDA approval, nonstandard manufacturing practices, plus the potential risk of unknown drug-drug interactions while on inpatient medications, the Pharmacy and Therapeutics Committee does not permit the use of "herbal" or natural products of this type within [facility]   ACTION TAKEN: The pharmacy department is unable to verify this order at this time and your patient has been informed of this safety policy. Please reevaluate patient's clinical condition at discharge and address if the herbal or natural product(s) should be resumed at that time.

## 2023-08-31 NOTE — Assessment & Plan Note (Signed)
Home losartan 50 mg daily resumed on admission

## 2023-08-31 NOTE — Assessment & Plan Note (Signed)
This meets criteria for morbid obesity based on the presence of 1 or more chronic comorbidities. Patient has 37 and htn. This complicates overall care and prognosis.

## 2023-08-31 NOTE — Assessment & Plan Note (Addendum)
CPAP was usually ordered, patient declined states that he no longer needed CPAP

## 2023-09-01 DIAGNOSIS — I214 Non-ST elevation (NSTEMI) myocardial infarction: Secondary | ICD-10-CM | POA: Diagnosis not present

## 2023-09-01 LAB — CBC
HCT: 35.1 % — ABNORMAL LOW (ref 39.0–52.0)
Hemoglobin: 11.8 g/dL — ABNORMAL LOW (ref 13.0–17.0)
MCH: 27.9 pg (ref 26.0–34.0)
MCHC: 33.6 g/dL (ref 30.0–36.0)
MCV: 83 fL (ref 80.0–100.0)
Platelets: 215 10*3/uL (ref 150–400)
RBC: 4.23 MIL/uL (ref 4.22–5.81)
RDW: 14.9 % (ref 11.5–15.5)
WBC: 10.1 10*3/uL (ref 4.0–10.5)
nRBC: 0 % (ref 0.0–0.2)

## 2023-09-01 LAB — BASIC METABOLIC PANEL
Anion gap: 8 (ref 5–15)
BUN: 14 mg/dL (ref 8–23)
CO2: 24 mmol/L (ref 22–32)
Calcium: 9.1 mg/dL (ref 8.9–10.3)
Chloride: 105 mmol/L (ref 98–111)
Creatinine, Ser: 0.9 mg/dL (ref 0.61–1.24)
GFR, Estimated: >60 mL/min (ref >60)
Glucose, Bld: 115 mg/dL — ABNORMAL HIGH (ref 70–99)
Potassium: 3.7 mmol/L (ref 3.5–5.1)
Sodium: 137 mmol/L (ref 135–145)

## 2023-09-01 LAB — TROPONIN I (HIGH SENSITIVITY): Troponin I (High Sensitivity): 1751 ng/L (ref <18)

## 2023-09-01 LAB — HEPARIN LEVEL (UNFRACTIONATED)
Heparin Unfractionated: 0.43 [IU]/mL (ref 0.30–0.70)
Heparin Unfractionated: 0.41 [IU]/mL (ref 0.30–0.70)

## 2023-09-01 MED ORDER — ATORVASTATIN CALCIUM 20 MG PO TABS
40.0000 mg | ORAL_TABLET | Freq: Every day | ORAL | Status: DC
  Administered 2023-09-01: 40 mg via ORAL
  Filled 2023-09-01: qty 2

## 2023-09-01 MED ORDER — CARVEDILOL 6.25 MG PO TABS
6.2500 mg | ORAL_TABLET | Freq: Two times a day (BID) | ORAL | Status: DC
  Administered 2023-09-01 (×2): 6.25 mg via ORAL
  Filled 2023-09-01 (×2): qty 1

## 2023-09-01 NOTE — Consult Note (Signed)
PHARMACY - ANTICOAGULATION CONSULT NOTE  Pharmacy Consult for Heparin Infusion Indication: chest pain/ACS  No Known Allergies  Patient Measurements: Height: 5\' 10"  (177.8 cm) Weight: 117 kg (257 lb 15 oz) IBW/kg (Calculated) : 73 Heparin Dosing Weight: 99 kg  Vital Signs: Temp: 97.8 F (36.6 C) (11/03 0000) Temp Source: Oral (11/03 0000) BP: 136/105 (11/03 0100) Pulse Rate: 83 (11/03 0100)  Labs: Recent Labs    08/31/23 1240 08/31/23 1438 08/31/23 1627 08/31/23 2005 09/01/23 0121  HGB 12.9*  --   --   --   --   HCT 40.0  --   --   --   --   PLT 243  --   --   --   --   APTT  --   --  26  --   --   LABPROT  --   --  13.7  --   --   INR  --   --  1.0  --   --   HEPARINUNFRC  --   --   --   --  0.43  CREATININE 1.11  --   --   --   --   TROPONINIHS 89* 292*  --  1,636*  --     Estimated Creatinine Clearance: 80.5 mL/min (by C-G formula based on SCr of 1.11 mg/dL).   Medical History: Past Medical History:  Diagnosis Date   Arthritis    Atrial fibrillation (HCC)    chronic   COVID-19 11/02/2019   Dysrhythmia    A-fib   Assessment: Cody Golden is a 67 y.o. male presenting with chest pain. PMH significant for HTN, OSA, AF. Patient was not on [facility] PTA per chart review. Pharmacy has been consulted to initiate and manage heparin infusion.   Baseline Labs: aPTT 26, PT 13.7, INR 1.0, Hgb 12.9, Hct 40.0, Plt 243   Goal of Therapy:  Heparin level 0.3-0.7 units/ml Monitor platelets by anticoagulation protocol: Yes   11/03 0121 HL 0.43, therapeutic x 1  Plan:  Continue heparin infusion at 1200 units/hr Recheck HL in 6 hours  Continue to monitor H&H and platelets daily while on heparin infusion   Otelia Sergeant, PharmD, [facility] 09/01/2023 2:39 AM

## 2023-09-01 NOTE — Consult Note (Signed)
PHARMACY - ANTICOAGULATION CONSULT NOTE  Pharmacy Consult for Heparin Infusion Indication: chest pain/ACS  No Known Allergies  Patient Measurements: Height: 5\' 10"  (177.8 cm) Weight: 117 kg (257 lb 15 oz) IBW/kg (Calculated) : 73 Heparin Dosing Weight: 99 kg  Vital Signs: Temp: 98.6 F (37 C) (11/03 0739) Temp Source: Oral (11/03 0739) BP: 124/96 (11/03 0740) Pulse Rate: 97 (11/03 0740)  Labs: Recent Labs    08/31/23 1240 08/31/23 1438 08/31/23 1627 08/31/23 2005 09/01/23 0121 09/01/23 0539 09/01/23 0737  HGB 12.9*  --   --   --   --  11.8*  --   HCT 40.0  --   --   --   --  35.1*  --   PLT 243  --   --   --   --  215  --   APTT  --   --  26  --   --   --   --   LABPROT  --   --  13.7  --   --   --   --   INR  --   --  1.0  --   --   --   --   HEPARINUNFRC  --   --   --   --  0.43  --  0.41  CREATININE 1.11  --   --   --   --  0.90  --   TROPONINIHS 89* 292*  --  1,636*  --  1,751*  --     Estimated Creatinine Clearance: 99.3 mL/min (by C-G formula based on SCr of 0.9 mg/dL).   Medical History: Past Medical History:  Diagnosis Date   Arthritis    Atrial fibrillation (HCC)    chronic   COVID-19 11/02/2019   Dysrhythmia    A-fib   Assessment: Cody Golden is a 67 y.o. male presenting with chest pain. PMH significant for HTN, OSA, AF. Patient was not on [facility] PTA per chart review. Pharmacy has been consulted to initiate and manage heparin infusion.   Baseline Labs: aPTT 26, PT 13.7, INR 1.0, Hgb 12.9, Hct 40.0, Plt 243   Goal of Therapy:  Heparin level 0.3-0.7 units/ml Monitor platelets by anticoagulation protocol: Yes   11/03 0121 HL 0.43, therapeutic x 1 11/03 0737 HL 0.41, therapeutic x 2  Plan:  Continue heparin infusion at 1200 units/hr Recheck HL with AM labs Continue to monitor H&H and platelets daily while on heparin infusion   Bettey Costa, PharmD Clinical Pharmacist 09/01/2023 8:51 AM

## 2023-09-01 NOTE — Progress Notes (Signed)
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 He reports that morning while he was cleaning his parrot's birdcage, he developed sharp chest pain that radiated down to his left elbow.  The upper extremity pain then resolved and then the pain became a dull aching pain.  He reports the pain is substernal and towards the left side of his chest.   Hospital course / significant events:  11/02: to ED, troponin 89 --> 292, chest pain resolved on nitroglycerin gtt, started heparin gtt for NSTEMI, troponin ---> 1636 11/03: troponin 1751, continue to trend per cardiology. Plan for cardiac cath tomorrow.   Consultants:  Cardiology  Procedures/Surgeries: [Pending cardiac catheterization]      ASSESSMENT & PLAN:    NSTEMI (non-ST elevated myocardial infarction)  Heparin GTT per pharmacy continued on admission Nitroglycerin infusion continued on admission Cardiology consult [facility]) Plan cardiac cath tomorrow 11/04 / sooner if needed Symptomatic support for chest pain  Trend troponin  ASA, statin, beta blocker, ARB  Lipids, A1C   Hx OSA (obstructive sleep apnea) CPAP was ordered, patient declined states that he no longer needed CPAP    Essential hypertension Home losartan 50 mg daily continued on admission Added beta blocker w/ carvedilol 6.25 mg bid    Ascending thoracic aortic aneurysm noted on CT  up to 4.6 cm in diameter. semi-annual imaging follow-up by CTA or MRA  referral to cardiothoracic surgery outpatient   Obesity, Class II, BMI 35-39.9 This meets criteria for morbid  obesity based on the presence of 1 or more chronic comorbidities. Patient has ACS, OSA, and htn. This complicates overall care and prognosis.  Follow outpatient    DVT prophylaxis: on heparin gtt  IV fluids: no continuous IV fluids  Nutrition: cardiac Central lines / invasive devices: none  Code Status: FULL CODE ACP documentation reviewed: none on file in [facility]  TOC needs: none at this time Barriers to dispo / significant pending items: pending cardiac cath tomorrow              Subjective / Brief ROS:  Patient reports no concerns at this time, resting but easily awoken  Denies CP/SOB.  Pain controlled.  Denies new weakness.  Tolerating diet.  Reports no concerns w/ urination/defecation.   Family Communication: none at this time     Objective Findings:  Vitals:   09/01/23 0400 09/01/23 0500 09/01/23 0739 09/01/23 0740  BP: 124/89 (!) 139/107  (!) 124/96  Pulse: 85 81  97  Resp: (!) 8 11  11   Temp:   98.6 F (37 C)   TempSrc:   Oral   SpO2: 95% 97%    Weight:      Height:        Intake/Output Summary (Last 24 hours) at 09/01/2023 1229 Last data filed at 09/01/2023 0300 Gross per 24 hour  Intake 382.28 ml  Output 500 ml  Net -117.72 ml   Filed Weights   08/31/23 1237  Weight: 117 kg    Examination:  Physical Exam Constitutional:      General: He is not in acute distress. Cardiovascular:     Rate  and Rhythm: Normal rate and regular rhythm.  Pulmonary:     Effort: Pulmonary effort is normal.     Breath sounds: Normal breath sounds.  Neurological:     General: No focal deficit present.     Mental Status: He is alert and oriented to person, place, and time.  Psychiatric:        Mood and Affect: Mood normal.        Behavior: Behavior normal.          Scheduled Medications:   aspirin  81 mg Oral Daily   aspirin  81 mg Oral Pre-Cath   atorvastatin  40 mg Oral Daily   carvedilol  6.25 mg Oral BID WC   latanoprost  1 drop Both Eyes QHS    losartan  50 mg Oral Daily   multivitamin with minerals  1 tablet Oral Daily   sodium chloride flush  3 mL Intravenous Q12H   timolol  1 drop Both Eyes q morning    Continuous Infusions:  sodium chloride     sodium chloride     heparin 1,200 Units/hr (08/31/23 1731)   nitroGLYCERIN 45 mcg/min (09/01/23 1100)    PRN Medications:  sodium chloride, acetaminophen **OR** acetaminophen, fluticasone, hydrALAZINE, morphine injection, ondansetron **OR** ondansetron (ZOFRAN) IV, senna-docusate, sodium chloride flush, tiZANidine  Antimicrobials from admission:  Anti-infectives (From admission, onward)    None           Data Reviewed:  I have personally reviewed the following...  CBC: Recent Labs  Lab 08/31/23 1240 09/01/23 0539  WBC 5.5 10.1  HGB 12.9* 11.8*  HCT 40.0 35.1*  MCV 84.6 83.0  PLT 243 215   Basic Metabolic Panel: Recent Labs  Lab 08/31/23 1240 09/01/23 0539  NA 138 137  K 3.8 3.7  CL 108 105  CO2 22 24  GLUCOSE 114* 115*  BUN 14 14  CREATININE 1.11 0.90  CALCIUM 9.2 9.1   GFR: Estimated Creatinine Clearance: 99.3 mL/min (by C-G formula based on SCr of 0.9 mg/dL). Liver Function Tests: No results for input(s): "AST", "ALT", "ALKPHOS", "BILITOT", "PROT", "ALBUMIN" in the last 168 hours. No results for input(s): "LIPASE", "AMYLASE" in the last 168 hours. No results for input(s): "AMMONIA" in the last 168 hours. Coagulation Profile: Recent Labs  Lab 08/31/23 1627  INR 1.0   Cardiac Enzymes: No results for input(s): "CKTOTAL", "CKMB", "CKMBINDEX", "TROPONINI" in the last 168 hours. BNP (last 3 results) No results for input(s): "PROBNP" in the last 8760 hours. HbA1C: No results for input(s): "HGBA1C" in the last 72 hours. CBG: No results for input(s): "GLUCAP" in the last 168 hours. Lipid Profile: No results for input(s): "CHOL", "HDL", "LDLCALC", "TRIG", "CHOLHDL", "LDLDIRECT" in the last 72 hours. Thyroid Function Tests: No results for  input(s): "TSH", "T4TOTAL", "FREET4", "T3FREE", "THYROIDAB" in the last 72 hours. Anemia Panel: No results for input(s): "VITAMINB12", "FOLATE", "FERRITIN", "TIBC", "IRON", "RETICCTPCT" in the last 72 hours. Most Recent Urinalysis On File:     Component Value Date/Time   COLORURINE YELLOW (A) 07/05/2022 0934   APPEARANCEUR HAZY (A) 07/05/2022 0934   APPEARANCEUR Hazy 08/18/2012 1420   LABSPEC 1.027 07/05/2022 0934   LABSPEC 1.020 08/18/2012 1420   PHURINE 5.0 07/05/2022 0934   GLUCOSEU NEGATIVE 07/05/2022 0934   GLUCOSEU Negative 08/18/2012 1420   HGBUR NEGATIVE 07/05/2022 0934   BILIRUBINUR NEGATIVE 07/05/2022 0934   BILIRUBINUR negative 05/20/2020 1359   BILIRUBINUR Negative 08/18/2012 1420   KETONESUR NEGATIVE 07/05/2022 0934   PROTEINUR 30 (  A) 07/05/2022 0934   UROBILINOGEN 0.2 05/20/2020 1359   NITRITE NEGATIVE 07/05/2022 0934   LEUKOCYTESUR NEGATIVE 07/05/2022 0934   LEUKOCYTESUR Negative 08/18/2012 1420   Sepsis Labs: @LABRCNTIP (procalcitonin:4,lacticidven:4) Microbiology: No results found for this or any previous visit (from the past 240 hour(s)).    Radiology Studies last 3 days: CT Angio Chest Aorta W and/or Wo Contrast  Result Date: 08/31/2023 CLINICAL DATA:  Chest pain radiating to the left arm. Acute aortic syndrome suspected. Ascending aortic aneurysm. EXAM: CT ANGIOGRAPHY CHEST WITH CONTRAST TECHNIQUE: Multidetector CT imaging of the chest was performed using the standard protocol during bolus administration of intravenous contrast. Multiplanar CT image reconstructions and MIPs were obtained to evaluate the vascular anatomy. RADIATION DOSE REDUCTION: This exam was performed according to the departmental dose-optimization program which includes automated exposure control, adjustment of the mA and/or kV according to patient size and/or use of iterative reconstruction technique. CONTRAST:  75mL OMNIPAQUE IOHEXOL 350 MG/ML SOLN COMPARISON:  Chest radiograph dated  08/31/2023 and CT dated 12/22/2022. FINDINGS: Cardiovascular: There is no cardiomegaly or pericardial effusion. There is coronary vascular calcification of the LAD. Dilated ascending aorta measures up to 4.6 cm in diameter. No aortic dissection. The descending thoracic aorta is tortuous. There is mild atherosclerotic calcification of the thoracic aorta. No periaortic fluid collection. The origins of the great vessels of the aortic arch appear patent. Mediastinum/Nodes: No hilar or mediastinal adenopathy. Calcified granuloma. The esophagus is grossly unremarkable. No mediastinal fluid collection. Lungs/Pleura: Several scattered calcified granuloma. No focal consolidation, pleural effusion, or pneumothorax. The central airways are patent. Upper Abdomen: No acute abnormality. Musculoskeletal: Osteopenia with degenerative changes of spine. No acute osseous pathology. Review of the MIP images confirms the above findings. IMPRESSION: 1. No acute intrathoracic pathology.  No aortic dissection. 2. Dilated ascending aorta measures up to 4.6 cm in diameter. Ascending thoracic aortic aneurysm. Recommend semi-annual imaging followup by CTA or MRA and referral to cardiothoracic surgery if not already obtained. This recommendation follows 2010 ACCF/AHA/AATS/ACR/ASA/SCA/SCAI/SIR/STS/SVM Guidelines for the Diagnosis and Management of Patients With Thoracic Aortic Disease. Circulation. 2010; 121: Z610-R604. Aortic aneurysm NOS (ICD10-I71.9). 3.  Aortic Atherosclerosis (ICD10-I70.0). Electronically Signed   By: Elgie Collard M.D.   On: 08/31/2023 16:14   DG Chest 2 View  Result Date: 08/31/2023 CLINICAL DATA:  Chest pain EXAM: CHEST - 2 VIEW COMPARISON:  X-ray 03/18/2007 FINDINGS: Calcified right lung base nodule consistent with old granulomatous disease and unchanged. No consolidation, pneumothorax or effusion. No edema. Normal cardiopericardial silhouette. Tortuous ectatic aorta. Surgical changes along the right humeral head  at the edge of the imaging field. Degenerative changes of the spine. Air-fluid level along the stomach beneath the left hemidiaphragm. IMPRESSION: No acute cardiopulmonary disease. Electronically Signed   By: Karen Kays M.D.   On: 08/31/2023 13:25        Sunnie Nielsen, DO [facility] 09/01/2023, 12:29 PM    Dictation software may have been used to generate the above note. Typos may occur and escape review in typed/dictated notes. Please contact Dr Lyn Hollingshead directly for clarity if needed.  Staff may message me via secure chat in Epic  but this may not receive an immediate response,  please page me for urgent matters!  If 7PM-7AM, please contact night coverage [email or web address]

## 2023-09-02 ENCOUNTER — Inpatient Hospital Stay (HOSPITAL_COMMUNITY)
Admit: 2023-09-02 | Discharge: 2023-09-02 | Disposition: A | Discharge status: Home / Self Care | Payer: Medicare PPO | Attending: Student | Admitting: Student

## 2023-09-02 ENCOUNTER — Encounter
Admission: EM | Disposition: A | Discharge status: Home / Self Care | Payer: Self-pay | Source: Home / Self Care | Attending: Osteopathic Medicine

## 2023-09-02 DIAGNOSIS — I214 Non-ST elevation (NSTEMI) myocardial infarction: Secondary | ICD-10-CM | POA: Diagnosis not present

## 2023-09-02 HISTORY — PX: LEFT HEART CATH AND CORONARY ANGIOGRAPHY: CATH118249

## 2023-09-02 LAB — LIPID PANEL
Cholesterol: 184 mg/dL (ref 0–200)
HDL: 49 mg/dL (ref >40)
LDL Cholesterol: 121 mg/dL — ABNORMAL HIGH (ref 0–99)
Total CHOL/HDL Ratio: 3.8 {ratio}
Triglycerides: 68 mg/dL (ref <150)
VLDL: 14 mg/dL (ref 0–40)

## 2023-09-02 LAB — HEMOGLOBIN A1C
Hgb A1c MFr Bld: 5.9 % — ABNORMAL HIGH (ref 4.8–5.6)
Mean Plasma Glucose: 122.63 mg/dL

## 2023-09-02 LAB — BASIC METABOLIC PANEL
Anion gap: 10 (ref 5–15)
BUN: 12 mg/dL (ref 8–23)
CO2: 25 mmol/L (ref 22–32)
Calcium: 9.1 mg/dL (ref 8.9–10.3)
Chloride: 102 mmol/L (ref 98–111)
Creatinine, Ser: 0.93 mg/dL (ref 0.61–1.24)
GFR, Estimated: >60 mL/min (ref >60)
Glucose, Bld: 113 mg/dL — ABNORMAL HIGH (ref 70–99)
Potassium: 3.6 mmol/L (ref 3.5–5.1)
Sodium: 137 mmol/L (ref 135–145)

## 2023-09-02 LAB — ECHOCARDIOGRAM COMPLETE
AR max vel: 2.81 cm2
AV Area VTI: 2.93 cm2
AV Area mean vel: 2.8 cm2
AV Mean grad: 2 mm[Hg]
AV Peak grad: 3 mm[Hg]
Ao pk vel: 0.87 m/s
Area-P 1/2: 4.1 cm2
Height: 70 in
MV VTI: 2.17 cm2
S' Lateral: 3.4 cm
Weight: 4127.01 [oz_av]

## 2023-09-02 LAB — HEPARIN LEVEL (UNFRACTIONATED): Heparin Unfractionated: 0.3 [IU]/mL (ref 0.30–0.70)

## 2023-09-02 SURGERY — LEFT HEART CATH AND CORONARY ANGIOGRAPHY
Anesthesia: Moderate Sedation

## 2023-09-02 MED ORDER — HEPARIN (PORCINE) IN NACL 1000-0.9 UT/500ML-% IV SOLN
INTRAVENOUS | Status: DC | PRN
  Administered 2023-09-02 (×2): 500 mL

## 2023-09-02 MED ORDER — NITROGLYCERIN IN D5W 200-5 MCG/ML-% IV SOLN
INTRAVENOUS | Status: AC
  Filled 2023-09-02: qty 250

## 2023-09-02 MED ORDER — FENTANYL CITRATE (PF) 100 MCG/2ML IJ SOLN
INTRAMUSCULAR | Status: DC | PRN
  Administered 2023-09-02: 25 ug via INTRAVENOUS

## 2023-09-02 MED ORDER — CLOPIDOGREL BISULFATE 75 MG PO TABS: 75.0000 mg | ORAL_TABLET | Freq: Every day | ORAL | Status: DC

## 2023-09-02 MED ORDER — SODIUM CHLORIDE 0.9 % WEIGHT BASED INFUSION: 1.0000 mL/kg/h | INTRAVENOUS | Status: DC

## 2023-09-02 MED ORDER — HEPARIN SODIUM (PORCINE) 1000 UNIT/ML IJ SOLN
INTRAMUSCULAR | Status: DC | PRN
  Administered 2023-09-02: 5000 [IU] via INTRAVENOUS

## 2023-09-02 MED ORDER — MIDAZOLAM HCL 2 MG/2ML IJ SOLN
INTRAMUSCULAR | Status: DC | PRN
  Administered 2023-09-02: 1 mg via INTRAVENOUS

## 2023-09-02 MED ORDER — SODIUM CHLORIDE 0.9 % IV SOLN: INTRAVENOUS | Status: AC

## 2023-09-02 MED ORDER — ASPIRIN 81 MG PO CHEW: 81.0000 mg | CHEWABLE_TABLET | Freq: Every day | ORAL | Status: DC

## 2023-09-02 MED ORDER — FENTANYL CITRATE (PF) 100 MCG/2ML IJ SOLN
INTRAMUSCULAR | Status: AC
  Filled 2023-09-02: qty 2

## 2023-09-02 MED ORDER — LABETALOL HCL 5 MG/ML IV SOLN
INTRAVENOUS | Status: DC | PRN
  Administered 2023-09-02: 20 mg via INTRAVENOUS

## 2023-09-02 MED ORDER — LABETALOL HCL 5 MG/ML IV SOLN
10.0000 mg | INTRAVENOUS | Status: AC | PRN
Start: 2023-09-02 — End: 2023-09-02

## 2023-09-02 MED ORDER — CLOPIDOGREL BISULFATE 75 MG PO TABS
ORAL_TABLET | ORAL | Status: AC
  Administered 2023-09-02: 75 mg via ORAL
  Filled 2023-09-02: qty 1

## 2023-09-02 MED ORDER — HEPARIN SODIUM (PORCINE) 1000 UNIT/ML IJ SOLN
INTRAMUSCULAR | Status: AC
  Filled 2023-09-02: qty 10

## 2023-09-02 MED ORDER — ATORVASTATIN CALCIUM 40 MG PO TABS: 40.0000 mg | ORAL_TABLET | Freq: Every day | ORAL | 0 refills | Status: AC

## 2023-09-02 MED ORDER — IOHEXOL 300 MG/ML  SOLN
INTRAMUSCULAR | Status: DC | PRN
  Administered 2023-09-02: 175 mL

## 2023-09-02 MED ORDER — VERAPAMIL HCL 2.5 MG/ML IV SOLN
INTRAVENOUS | Status: DC | PRN
  Administered 2023-09-02: 2.5 mg via INTRA_ARTERIAL

## 2023-09-02 MED ORDER — CLOPIDOGREL BISULFATE 75 MG PO TABS: 75.0000 mg | ORAL_TABLET | Freq: Every day | ORAL | 0 refills | Status: AC

## 2023-09-02 MED ORDER — MIDAZOLAM HCL 2 MG/2ML IJ SOLN
INTRAMUSCULAR | Status: AC
  Filled 2023-09-02: qty 2

## 2023-09-02 MED ORDER — HEPARIN (PORCINE) IN NACL 1000-0.9 UT/500ML-% IV SOLN
INTRAVENOUS | Status: AC
  Filled 2023-09-02: qty 1000

## 2023-09-02 MED ORDER — LABETALOL HCL 5 MG/ML IV SOLN
INTRAVENOUS | Status: AC
Start: 2023-09-02 — End: ?
  Filled 2023-09-02: qty 4

## 2023-09-02 MED ORDER — CARVEDILOL 6.25 MG PO TABS: 6.2500 mg | ORAL_TABLET | Freq: Two times a day (BID) | ORAL | 0 refills | Status: AC

## 2023-09-02 MED ORDER — VERAPAMIL HCL 2.5 MG/ML IV SOLN
INTRAVENOUS | Status: AC
  Filled 2023-09-02: qty 2

## 2023-09-02 SURGICAL SUPPLY — 14 items
CATH 5FR JL3.5 JR4 ANG PIG MP (CATHETERS) IMPLANT
CATH INFINITI 5FR JL4 (CATHETERS) IMPLANT
DEVICE RAD TR BAND REGULAR (VASCULAR PRODUCTS) IMPLANT
DRAPE BRACHIAL (DRAPES) IMPLANT
GLIDESHEATH SLEND SS 6F .021 (SHEATH) IMPLANT
GUIDEWIRE INQWIRE 1.5J.035X260 (WIRE) IMPLANT
INQWIRE 1.5J .035X260CM (WIRE) ×1
KIT SYRINGE INJ CVI SPIKEX1 (MISCELLANEOUS) IMPLANT
PACK CARDIAC CATH (CUSTOM PROCEDURE TRAY) ×1 IMPLANT
PROTECTION STATION PRESSURIZED (MISCELLANEOUS) ×1
SET ATX-X65L (MISCELLANEOUS) IMPLANT
SHEATH 6FR 85 DEST SLENDER (SHEATH) IMPLANT
STATION PROTECTION PRESSURIZED (MISCELLANEOUS) IMPLANT
WIRE HITORQ VERSACORE ST 145CM (WIRE) IMPLANT

## 2023-09-02 NOTE — Plan of Care (Signed)

## 2023-09-02 NOTE — Discharge Summary (Signed)
Physician Discharge Summary   Patient: Cody Golden MRN: [id number]  DOB: 02-24-1956   Admit:     Date of Admission: 08/31/2023 Admitted from: home   Discharge: Date of discharge: 09/02/23 Disposition: Home Condition at discharge: good  CODE STATUS: FULL CODE     Discharge Physician: Sunnie Nielsen, DO [facility]     PCP: [name], MD  Recommendations for Outpatient Follow-up:  Follow up with PCP [name], MD in 2-4 weeks for hospital follow up Follow up with cardiology Dr Juliann Pares 1 week Ascending thoracic aortic aneurysm noted on CT  --> up to 4.6 cm in diameter, needs semi-annual imaging follow-up by CTA or MRA   Discharge Instructions     AMB referral to Phase II Cardiac Rehabilitation   Complete by: As directed    Diagnosis: NSTEMI   After initial evaluation and assessments completed: Virtual Based Care may be provided alone or in conjunction with Phase 2 Cardiac Rehab based on patient barriers.: Yes   Intensive Cardiac Rehabilitation (ICR) [facility] location only OR Traditional Cardiac Rehabilitation (TCR) *If criteria for ICR are not met will enroll in TCR [employer] only): Yes   Diet - low sodium heart healthy   Complete by: As directed    Increase activity slowly   Complete by: As directed          Discharge Diagnoses: Principal Problem:   NSTEMI (non-ST elevated myocardial infarction) (HCC) Active Problems:   Chest pain   Essential hypertension   OSA (obstructive sleep apnea)   Atrial fibrillation (HCC)   MRI contraindicated due to metal implant   Obesity, Class II, BMI 35-39.9      HPI: Cody Golden is a 67 year old male with history of hypertension, atrial fibrillation, not on anticoagulation, hyperlipidemia, history of ascending aorta aneurysm without rupture, severe obesity, obstructive sleep apnea, who presents emergency department for chief concerns of chest pain. He reports that morning while he was cleaning his  parrot's birdcage, he developed sharp chest pain that radiated down to his left elbow.  The upper extremity pain then resolved and then the pain became a dull aching pain.  He reports the pain is substernal and towards the left side of his chest.   Hospital course / significant events:  11/02: to ED, troponin 89 --> 292, chest pain resolved on nitroglycerin gtt, started heparin gtt for NSTEMI, troponin ---> 1636 11/03: troponin 1751, continue to trend per cardiology. Plan for cardiac cath tomorrow.  11/04: cardiac cath nonobstructive disease   Consultants:  Cardiology  Procedures/Surgeries: [Pending cardiac catheterization]      ASSESSMENT & PLAN:    NSTEMI (non-ST elevated myocardial infarction)  S/p Cardiac cath 11/04 - nonobstructive  Echo EF 50-55% no RWMA  ASA, statin, plavix, beta blocker carvedilol, ARB losartan   Hx OSA (obstructive sleep apnea) CPAP was ordered, patient declined states that he no longer needed CPAP    Essential hypertension Home losartan 50 mg daily continued on admission Added beta blocker w/ carvedilol 6.25 mg bid    Ascending thoracic aortic aneurysm noted on CT  up to 4.6 cm in diameter. semi-annual imaging follow-up by CTA or MRA  referral to cardiothoracic surgery outpatient   Obesity, Class II, BMI 35-39.9 This meets criteria for morbid obesity based on the presence of 1 or more chronic comorbidities. Patient has ACS, OSA, and htn. This complicates overall care and prognosis.  Follow outpatient            Discharge  Instructions  Allergies as of 09/02/2023   No Known Allergies      Medication List     STOP taking these medications    cyclobenzaprine 5 MG tablet Commonly known as: FLEXERIL   predniSONE 10 MG tablet Commonly known as: DELTASONE   traMADol 50 MG tablet Commonly known as: ULTRAM       TAKE these medications    aspirin 81 MG chewable tablet Chew 81 mg by mouth daily.   atorvastatin 40 MG  tablet Commonly known as: LIPITOR Take 1 tablet (40 mg total) by mouth daily.   carvedilol 6.25 MG tablet Commonly known as: COREG Take 1 tablet (6.25 mg total) by mouth 2 (two) times daily with a meal.   clopidogrel 75 MG tablet Commonly known as: PLAVIX Take 1 tablet (75 mg total) by mouth daily with breakfast. Start taking on: September 03, 2023   co-enzyme Q-10 30 MG capsule Take 30 mg by mouth daily.   fluticasone 50 MCG/ACT nasal spray Commonly known as: FLONASE Place into the nose.   latanoprost 0.005 % ophthalmic solution Commonly known as: XALATAN Place 1 drop into both eyes at bedtime.   losartan 50 MG tablet Commonly known as: COZAAR Take 50 mg by mouth daily.   multivitamin tablet Take 1 tablet by mouth daily.   timolol 0.5 % ophthalmic solution Commonly known as: TIMOPTIC Place 1 drop into both eyes every morning.   tiZANidine 4 MG tablet Commonly known as: ZANAFLEX Take 4 mg by mouth every 8 (eight) hours.         Follow-up Information     Alwyn Pea, MD. Go in 1 week(s).   Specialties: Cardiology, Internal Medicine Contact information: [address] [place] [address] [phone]                 No Known Allergies   Subjective: pt reports feeling well, denies CP/SOB,   Discharge Exam: BP (!) 147/90   Pulse 84   Temp 98 F (36.7 C)   Resp 12   Ht 5\' 10"  (1.778 m)   Wt 117 kg   SpO2 96%   BMI 37.01 kg/m  General: Pt is alert, awake, not in acute distress Cardiovascular: RRR, S1/S2 +, no rubs, no gallops Respiratory: CTA bilaterally, no wheezing, no rhonchi Abdominal: Soft, NT, ND, bowel sounds + Extremities: no edema, no cyanosis     The results of significant diagnostics from this hospitalization (including imaging, microbiology, ancillary and laboratory) are listed below for reference.     Microbiology: No results found for this or any previous visit (from the past 240 hour(s)).   Labs: BNP  (last 3 results) No results for input(s): "BNP" in the last 8760 hours. Basic Metabolic Panel: Recent Labs  Lab 08/31/23 1240 09/01/23 0539 09/02/23 0546  NA 138 137 137  K 3.8 3.7 3.6  CL 108 105 102  CO2 22 24 25   GLUCOSE 114* 115* 113*  BUN 14 14 12   CREATININE 1.11 0.90 0.93  CALCIUM 9.2 9.1 9.1   Liver Function Tests: No results for input(s): "AST", "ALT", "ALKPHOS", "BILITOT", "PROT", "ALBUMIN" in the last 168 hours. No results for input(s): "LIPASE", "AMYLASE" in the last 168 hours. No results for input(s): "AMMONIA" in the last 168 hours. CBC: Recent Labs  Lab 08/31/23 1240 09/01/23 0539  WBC 5.5 10.1  HGB 12.9* 11.8*  HCT 40.0 35.1*  MCV 84.6 83.0  PLT 243 215   Cardiac Enzymes: No results for input(s): "  CKTOTAL", "CKMB", "CKMBINDEX", "TROPONINI" in the last 168 hours. BNP: Invalid input(s): "POCBNP" CBG: No results for input(s): "GLUCAP" in the last 168 hours. D-Dimer No results for input(s): "DDIMER" in the last 72 hours. Hgb A1c Recent Labs    09/02/23 0546  HGBA1C 5.9*   Lipid Profile Recent Labs    09/02/23 0546  CHOL 184  HDL 49  LDLCALC 121*  TRIG 68  CHOLHDL 3.8   Thyroid function studies No results for input(s): "TSH", "T4TOTAL", "T3FREE", "THYROIDAB" in the last 72 hours.  Invalid input(s): "FREET3" Anemia work up No results for input(s): "VITAMINB12", "FOLATE", "FERRITIN", "TIBC", "IRON", "RETICCTPCT" in the last 72 hours. Urinalysis    Component Value Date/Time   COLORURINE YELLOW (A) 07/05/2022 0934   APPEARANCEUR HAZY (A) 07/05/2022 0934   APPEARANCEUR Hazy 08/18/2012 1420   LABSPEC 1.027 07/05/2022 0934   LABSPEC 1.020 08/18/2012 1420   PHURINE 5.0 07/05/2022 0934   GLUCOSEU NEGATIVE 07/05/2022 0934   GLUCOSEU Negative 08/18/2012 1420   HGBUR NEGATIVE 07/05/2022 0934   BILIRUBINUR NEGATIVE 07/05/2022 0934   BILIRUBINUR negative 05/20/2020 1359   BILIRUBINUR Negative 08/18/2012 1420   KETONESUR NEGATIVE 07/05/2022  0934   PROTEINUR 30 (A) 07/05/2022 0934   UROBILINOGEN 0.2 05/20/2020 1359   NITRITE NEGATIVE 07/05/2022 0934   LEUKOCYTESUR NEGATIVE 07/05/2022 0934   LEUKOCYTESUR Negative 08/18/2012 1420   Sepsis Labs Recent Labs  Lab 08/31/23 1240 09/01/23 0539  WBC 5.5 10.1   Microbiology No results found for this or any previous visit (from the past 240 hour(s)). Imaging ECHOCARDIOGRAM COMPLETE  Result Date: 09/02/2023    ECHOCARDIOGRAM REPORT   Patient Name:   Cody Golden Date of Exam: 09/02/2023 Medical Rec #:  [id number]      Height:       70.0 in Accession #:    [id number]     Weight:       257.9 lb Date of Birth:  01-16-1956       BSA:          2.326 m Patient Age:    67 years       BP:           147/90 mmHg Patient Gender: M              HR:           84 bpm. Exam Location:  [facility] Procedure: 2D Echo, Cardiac Doppler and Color Doppler Indications:     NSTEMI I21.4  History:         Patient has prior history of Echocardiogram examinations, most                  recent 07/06/2022. Arrythmias:Atrial Fibrillation.  Sonographer:     Cristela Blue Referring Phys:  [id number] CARALYN HUDSON Diagnosing Phys: Rozell Searing Custovic IMPRESSIONS  1. Left ventricular ejection fraction, by estimation, is 50 to 55%. Left ventricular ejection fraction by PLAX is 51 %. The left ventricle has low normal function. The left ventricle has no regional wall motion abnormalities. Left ventricular diastolic parameters are indeterminate.  2. Right ventricular systolic function is normal. The right ventricular size is normal.  3. Left atrial size was severely dilated.  4. The mitral valve is grossly normal. Mild mitral valve regurgitation.  5. The aortic valve is grossly normal. Aortic valve regurgitation is not visualized. No aortic stenosis is present. FINDINGS  Left Ventricle: Left ventricular ejection fraction, by estimation, is 50 to 55%. Left ventricular ejection fraction by PLAX is  51 %. The left ventricle has low normal function. The  left ventricle has no regional wall motion abnormalities. The left ventricular internal cavity size was normal in size. There is no left ventricular hypertrophy. Left ventricular diastolic parameters are indeterminate. Right Ventricle: The right ventricular size is normal. No increase in right ventricular wall thickness. Right ventricular systolic function is normal. Left Atrium: Left atrial size was severely dilated. Right Atrium: Right atrial size was normal in size. Pericardium: There is no evidence of pericardial effusion. Mitral Valve: The mitral valve is grossly normal. Mild mitral valve regurgitation. MV peak gradient, 4.6 mmHg. The mean mitral valve gradient is 1.0 mmHg. Tricuspid Valve: The tricuspid valve is grossly normal. Tricuspid valve regurgitation is mild. Aortic Valve: The aortic valve is grossly normal. Aortic valve regurgitation is not visualized. No aortic stenosis is present. Aortic valve mean gradient measures 2.0 mmHg. Aortic valve peak gradient measures 3.0 mmHg. Aortic valve area, by VTI measures 2.93 cm. Pulmonic Valve: The pulmonic valve was grossly normal. Pulmonic valve regurgitation is not visualized. Aorta: The aortic root is normal in size and structure. IAS/Shunts: No atrial level shunt detected by color flow Doppler.  LEFT VENTRICLE PLAX 2D LV EF:         Left ventricular ejection fraction by PLAX is 51 %. LVIDd:         4.60 cm LVIDs:         3.40 cm LV PW:         1.30 cm LV IVS:        1.70 cm LVOT diam:     2.20 cm LV SV:         41 LV SV Index:   18 LVOT Area:     3.80 cm  RIGHT VENTRICLE RV Basal diam:  4.20 cm RV Mid diam:    3.30 cm LEFT ATRIUM            Index        RIGHT ATRIUM           Index LA diam:      5.10 cm  2.19 cm/m   RA Area:     26.50 cm LA Vol (A2C): 70.9 ml  30.49 ml/m  RA Volume:   79.90 ml  34.36 ml/m LA Vol (A4C): 116.0 ml 49.88 ml/m  AORTIC VALVE AV Area (Vmax):    2.81 cm AV Area (Vmean):   2.80 cm AV Area (VTI):     2.93 cm AV Vmax:            86.50 cm/s AV Vmean:          63.500 cm/s AV VTI:            0.140 m AV Peak Grad:      3.0 mmHg AV Mean Grad:      2.0 mmHg LVOT Vmax:         63.90 cm/s LVOT Vmean:        46.700 cm/s LVOT VTI:          0.108 m LVOT/AV VTI ratio: 0.77  AORTA Ao Root diam: 4.30 cm MITRAL VALVE               TRICUSPID VALVE MV Area (PHT): 4.10 cm    TR Peak grad:   25.0 mmHg MV Area VTI:   2.17 cm    TR Vmax:        250.00 cm/s MV Peak grad:  4.6 mmHg MV Mean grad:  1.0 mmHg    SHUNTS MV Vmax:       1.07 m/s    Systemic VTI:  0.11 m MV Vmean:      46.1 cm/s   Systemic Diam: 2.20 cm MV Decel Time: 185 msec MV E velocity: 95.90 cm/s Rozell Searing Custovic Electronically signed by Clotilde Dieter Signature Date/Time: 09/02/2023/2:13:42 PM    Final       Time coordinating discharge: over 30 minutes  SIGNED:  Sunnie Nielsen DO [facility]

## 2023-09-02 NOTE — Consult Note (Signed)
PHARMACY - ANTICOAGULATION CONSULT NOTE  Pharmacy Consult for Heparin Infusion Indication: chest pain/ACS  No Known Allergies  Patient Measurements: Height: 5\' 10"  (177.8 cm) Weight: 117 kg (257 lb 15 oz) IBW/kg (Calculated) : 73 Heparin Dosing Weight: 99 kg  Vital Signs: Temp: 98.2 F (36.8 C) (11/04 0445) Temp Source: Oral (11/03 1936) BP: 163/96 (11/04 0445) Pulse Rate: 88 (11/04 0445)  Labs: Recent Labs    08/31/23 1240 08/31/23 1438 08/31/23 1627 08/31/23 2005 09/01/23 0121 09/01/23 0539 09/01/23 0737 09/02/23 0546  HGB 12.9*  --   --   --   --  11.8*  --   --   HCT 40.0  --   --   --   --  35.1*  --   --   PLT 243  --   --   --   --  215  --   --   APTT  --   --  26  --   --   --   --   --   LABPROT  --   --  13.7  --   --   --   --   --   INR  --   --  1.0  --   --   --   --   --   HEPARINUNFRC  --   --   --   --  0.43  --  0.41 0.30  CREATININE 1.11  --   --   --   --  0.90  --   --   TROPONINIHS 89* 292*  --  1,636*  --  1,751*  --   --     Estimated Creatinine Clearance: 99.3 mL/min (by C-G formula based on SCr of 0.9 mg/dL).   Medical History: Past Medical History:  Diagnosis Date   Arthritis    Atrial fibrillation (HCC)    chronic   COVID-19 11/02/2019   Dysrhythmia    A-fib   Assessment: Cody Golden is a 67 y.o. male presenting with chest pain. PMH significant for HTN, OSA, AF. Patient was not on [facility] PTA per chart review. Pharmacy has been consulted to initiate and manage heparin infusion.   Baseline Labs: aPTT 26, PT 13.7, INR 1.0, Hgb 12.9, Hct 40.0, Plt 243   Goal of Therapy:  Heparin level 0.3-0.7 units/ml Monitor platelets by anticoagulation protocol: Yes   11/03 0121 HL 0.43, therapeutic x 1 11/03 0737 HL 0.41, therapeutic x 2 11/04 0546 HL 0.30, therapeutic x 3  Plan:  Continue heparin infusion at 1200 units/hr Recheck HL with AM labs Continue to monitor H&H and platelets daily while on heparin infusion   Otelia Sergeant,  PharmD, [facility] 09/02/2023 6:21 AM

## 2023-09-02 NOTE — Progress Notes (Signed)
*  PRELIMINARY RESULTS* Echocardiogram 2D Echocardiogram has been performed.  Cristela Blue 09/02/2023, 1:05 PM

## 2023-09-02 NOTE — Progress Notes (Signed)
Consent signed, nitroglycerin gtt stopped, transported to cath lab.

## 2023-09-02 NOTE — CV Procedure (Signed)
Post cath note Preserved left ventricular function EF of at least 50%  Coronaries Mild irregularities in coronaries No significant obstructive disease  Recommend conservative medical therapy aspirin Plavix statin therapy blood pressure control  Anticipate discharge hopefully later today  Full cath note to follow

## 2023-09-02 NOTE — Progress Notes (Cosign Needed Addendum)
[facility] CLINIC CARDIOLOGY PROGRESS NOTE   Patient ID: WYAT Golden MRN: [id number] DOB/AGE: [birth year] 67 y.o.  Admit date: 08/31/2023 Referring Physician Dr. Londell Moh Primary Physician Cody Rud, MD  Primary Cardiologist Cody Golden, [place] Reason for Consultation NSTEMI  HPI: Cody Golden is a 67 y.o. male with a past medical history of chronic atrial fibrillation, hypertension, sleep apnea, hypercholesterolemia who presented to the ED on 08/31/2023 for chest pain. Troponins found to be elevated, cardiology was consulted for further evaluation.   Interval History:  -Patient seen after LHC this morning. Reports he feels well. R wrist without significant tenderness, bruising, no evidence of active oozing. -Denies any recurrence of chest pain. Denies SOB or palpitations.  -BP and HR stable.   Review of systems complete and found to be negative unless listed above    Vitals:   09/02/23 1030 09/02/23 1045 09/02/23 1100 09/02/23 1154  BP: (!) 137/100 133/84 (!) 141/84 (!) 147/90  Pulse: 70  79 84  Resp: (!) 21 12 12    Temp:    98 F (36.7 C)  TempSrc:      SpO2: 96% 96% 96%   Weight:      Height:         Intake/Output Summary (Last 24 hours) at 09/02/2023 1359 Last data filed at 09/02/2023 0645 Gross per 24 hour  Intake 320.8 ml  Output 1100 ml  Net -779.2 ml     PHYSICAL EXAM General: Well appearing, well nourished, in no acute distress. HEENT: Normocephalic and atraumatic. Neck: No JVD.  Lungs: Normal respiratory effort on room air. Clear bilaterally to auscultation. No wheezes, crackles, rhonchi.  Heart: Irregularly irregular. Normal S1 and S2 without gallops or murmurs. Radial & DP pulses 2+ bilaterally. Abdomen: Non-distended appearing.  Msk: Normal strength and tone for age. Extremities: No clubbing, cyanosis or edema.   Neuro: Alert and oriented X 3. Psych: Mood appropriate, affect congruent.    LABS: Basic Metabolic Panel: Recent Labs     09/01/23 0539 09/02/23 0546  NA 137 137  K 3.7 3.6  CL 105 102  CO2 24 25  GLUCOSE 115* 113*  BUN 14 12  CREATININE 0.90 0.93  CALCIUM 9.1 9.1   Liver Function Tests: No results for input(s): "AST", "ALT", "ALKPHOS", "BILITOT", "PROT", "ALBUMIN" in the last 72 hours. No results for input(s): "LIPASE", "AMYLASE" in the last 72 hours. CBC: Recent Labs    08/31/23 1240 09/01/23 0539  WBC 5.5 10.1  HGB 12.9* 11.8*  HCT 40.0 35.1*  MCV 84.6 83.0  PLT 243 215   Cardiac Enzymes: Recent Labs    08/31/23 1438 08/31/23 2005 09/01/23 0539  TROPONINIHS 292* 1,636* 1,751*   BNP: No results for input(s): "BNP" in the last 72 hours. D-Dimer: No results for input(s): "DDIMER" in the last 72 hours. Hemoglobin A1C: Recent Labs    09/02/23 0546  HGBA1C 5.9*   Fasting Lipid Panel: Recent Labs    09/02/23 0546  CHOL 184  HDL 49  LDLCALC 121*  TRIG 68  CHOLHDL 3.8   Thyroid Function Tests: No results for input(s): "TSH", "T4TOTAL", "T3FREE", "THYROIDAB" in the last 72 hours.  Invalid input(s): "FREET3" Anemia Panel: No results for input(s): "VITAMINB12", "FOLATE", "FERRITIN", "TIBC", "IRON", "RETICCTPCT" in the last 72 hours.  CT Angio Chest Aorta W and/or Wo Contrast  Result Date: 08/31/2023 CLINICAL DATA:  Chest pain radiating to the left arm. Acute aortic syndrome suspected. Ascending aortic aneurysm. EXAM: CT ANGIOGRAPHY CHEST WITH CONTRAST TECHNIQUE: Multidetector  CT imaging of the chest was performed using the standard protocol during bolus administration of intravenous contrast. Multiplanar CT image reconstructions and MIPs were obtained to evaluate the vascular anatomy. RADIATION DOSE REDUCTION: This exam was performed according to the departmental dose-optimization program which includes automated exposure control, adjustment of the mA and/or kV according to patient size and/or use of iterative reconstruction technique. CONTRAST:  75mL OMNIPAQUE IOHEXOL 350 MG/ML  SOLN COMPARISON:  Chest radiograph dated 08/31/2023 and CT dated 12/22/2022. FINDINGS: Cardiovascular: There is no cardiomegaly or pericardial effusion. There is coronary vascular calcification of the LAD. Dilated ascending aorta measures up to 4.6 cm in diameter. No aortic dissection. The descending thoracic aorta is tortuous. There is mild atherosclerotic calcification of the thoracic aorta. No periaortic fluid collection. The origins of the great vessels of the aortic arch appear patent. Mediastinum/Nodes: No hilar or mediastinal adenopathy. Calcified granuloma. The esophagus is grossly unremarkable. No mediastinal fluid collection. Lungs/Pleura: Several scattered calcified granuloma. No focal consolidation, pleural effusion, or pneumothorax. The central airways are patent. Upper Abdomen: No acute abnormality. Musculoskeletal: Osteopenia with degenerative changes of spine. No acute osseous pathology. Review of the MIP images confirms the above findings. IMPRESSION: 1. No acute intrathoracic pathology.  No aortic dissection. 2. Dilated ascending aorta measures up to 4.6 cm in diameter. Ascending thoracic aortic aneurysm. Recommend semi-annual imaging followup by CTA or MRA and referral to cardiothoracic surgery if not already obtained. This recommendation follows 2010 ACCF/AHA/AATS/ACR/ASA/SCA/SCAI/SIR/STS/SVM Guidelines for the Diagnosis and Management of Patients With Thoracic Aortic Disease. Circulation. 2010; 121: W098-J191. Aortic aneurysm NOS (ICD10-I71.9). 3.  Aortic Atherosclerosis (ICD10-I70.0). Electronically Signed   By: Elgie Collard M.D.   On: 08/31/2023 16:14     ECHO pending  TELEMETRY reviewed by me 09/02/23: atrial fibrillation rate 70s  EKG reviewed by me 09/02/23: atrial fibrillation rate 87 bpm  DATA reviewed by me 09/02/23: last 24h vitals tele labs imaging I/O, hospitalist progress note  Principal Problem:   NSTEMI (non-ST elevated myocardial infarction) (HCC) Active  Problems:   Chest pain   Essential hypertension   OSA (obstructive sleep apnea)   Atrial fibrillation (HCC)   MRI contraindicated due to metal implant   Obesity, Class II, BMI 35-39.9    ASSESSMENT AND PLAN: Cody Golden is a 67 y.o. male with a past medical history of chronic atrial fibrillation, hypertension, sleep apnea, hypercholesterolemia who presented to the ED on 08/31/2023 for chest pain. Troponins found to be elevated, cardiology was consulted for further evaluation.   # NSTEMI # Nonobstructive coronary artery disease # Hyperlipidemia Patient presenting with chest pain. Troponins 292 > 1636 > 1751. EKG nonacute. LHC today with nonobstructive disease.  -Echo pending -Continue aspirin 81 mg daily and plavix 75 mg daily.  -Continue atorvastatin 40 mg daily.   # Chronic atrial fibrillation Patient with long-standing hx of AF. Has historically declined anticoagulation, most recently at outpatient cardiology appointment 07/2023. -Continue carvedilol 6.25 mg twice daily for rate control.  -Recommend continued discussions about anticoagulation outpatient.   # Hypertension BP relatively controlled throughout admission.  -Continue losartan 50 mg daily and carvedilol as above.    Ok for discharge today from a cardiac perspective. Will arrange for follow up in clinic with Dr. Juliann Pares in 1-2 weeks.    This patient's case was discussed and created with Dr. Melton Alar and she is in agreement.  Signed:  Gale Journey, PA-C  09/02/2023, 1:59 PM [facility] Cardiology

## 2023-09-03 ENCOUNTER — Encounter: Payer: Self-pay | Admitting: Internal Medicine

## 2023-09-03 LAB — CARDIAC CATHETERIZATION: Cath EF Quantitative: 55 %

## 2023-11-11 ENCOUNTER — Other Ambulatory Visit: Payer: Self-pay

## 2023-11-11 ENCOUNTER — Encounter: Payer: Medicare PPO | Attending: Internal Medicine

## 2023-11-11 DIAGNOSIS — I214 Non-ST elevation (NSTEMI) myocardial infarction: Secondary | ICD-10-CM | POA: Insufficient documentation

## 2023-11-11 NOTE — Progress Notes (Signed)
 Virtual Visit completed. Patient informed on EP and RD appointment and 6 Minute walk test. Patient also informed of patient health questionnaires on My Chart. Patient Verbalizes understanding. Visit diagnosis can be found in [facility] 08/31/2023.

## 2023-11-13 VITALS — Ht 69.76 in | Wt 248.3 lb

## 2023-11-13 DIAGNOSIS — I214 Non-ST elevation (NSTEMI) myocardial infarction: Secondary | ICD-10-CM | POA: Diagnosis present

## 2023-11-13 NOTE — Progress Notes (Signed)
 Cardiac Individual Treatment Plan  Patient Details  Name: Cody Golden MRN: [id number] Date of Birth: [birth year] Referring Provider:   Flowsheet Row Cardiac Rehab from 11/13/2023 in [facility] Cardiac and Pulmonary Rehab  Referring Provider Dr. Burney Carter       Initial Encounter Date:  Flowsheet Row Cardiac Rehab from 11/13/2023 in [facility] Cardiac and Pulmonary Rehab  Date 11/13/23       Visit Diagnosis: NSTEMI (non-ST elevated myocardial infarction) [facility])  Patient's Home Medications on Admission:  Current Outpatient Medications:    aspirin  81 MG chewable tablet, Chew 81 mg by mouth daily., Disp: , Rfl:    atorvastatin  (LIPITOR) 40 MG tablet, Take 1 tablet (40 mg total) by mouth daily., Disp: 30 tablet, Rfl: 0   carvedilol  (COREG ) 6.25 MG tablet, Take 1 tablet (6.25 mg total) by mouth 2 (two) times daily with a meal., Disp: 60 tablet, Rfl: 0   clopidogrel  (PLAVIX ) 75 MG tablet, Take 1 tablet (75 mg total) by mouth daily with breakfast., Disp: 30 tablet, Rfl: 0   clopidogrel  (PLAVIX ) 75 MG tablet, Take by mouth. (Patient not taking: Reported on 11/11/2023), Disp: , Rfl:    co-enzyme Q-10 30 MG capsule, Take 30 mg by mouth daily., Disp: , Rfl:    fluticasone  (FLONASE ) 50 MCG/ACT nasal spray, Place into the nose. (Patient not taking: Reported on 11/11/2023), Disp: , Rfl:    fluticasone  (FLONASE ) 50 MCG/ACT nasal spray, Place into the nose. (Patient not taking: Reported on 11/11/2023), Disp: , Rfl:    latanoprost  (XALATAN ) 0.005 % ophthalmic solution, Place 1 drop into both eyes at bedtime., Disp: , Rfl:    losartan  (COZAAR ) 50 MG tablet, Take 50 mg by mouth daily. (Patient not taking: Reported on 11/11/2023), Disp: , Rfl:    losartan  (COZAAR ) 50 MG tablet, Take 1 tablet by mouth daily., Disp: , Rfl:    Multiple Vitamin (MULTIVITAMIN) tablet, Take 1 tablet by mouth daily., Disp: , Rfl:    timolol  (TIMOPTIC ) 0.5 % ophthalmic solution, Place 1 drop into both eyes every morning., Disp: , Rfl:     tiZANidine  (ZANAFLEX ) 4 MG tablet, Take 4 mg by mouth every 8 (eight) hours. (Patient not taking: Reported on 11/11/2023), Disp: , Rfl:   Past Medical History: Past Medical History:  Diagnosis Date   Arthritis    Atrial fibrillation (HCC)    chronic   COVID-19 11/02/2019   Dysrhythmia    A-fib    Tobacco Use: Social History   Tobacco Use  Smoking Status Never  Smokeless Tobacco Never    Labs: Review Flowsheet       Latest Ref Rng & Units 09/02/2023  Labs for ITP Cardiac and Pulmonary Rehab  Cholestrol 0 - 200 mg/dL 409   LDL (calc) 0 - 99 mg/dL 811   HDL-C >91 mg/dL 49   Trlycerides <478 mg/dL 68   Hemoglobin G9F 4.8 - 5.6 % 5.9      Exercise Target Goals: Exercise Program Goal: Individual exercise prescription set using results from initial 6 min walk test and THRR while considering  patient's activity barriers and safety.   Exercise Prescription Goal: Initial exercise prescription builds to 30-45 minutes a day of aerobic activity, 2-3 days per week.  Home exercise guidelines will be given to patient during program as part of exercise prescription that the participant will acknowledge.   Education: Aerobic Exercise: - Group verbal and visual presentation on the components of exercise prescription. Introduces F.I.T.T principle from ACSM for exercise prescriptions.  Reviews  F.I.T.T. principles of aerobic exercise including progression. Written material given at graduation.   Education: Resistance Exercise: - Group verbal and visual presentation on the components of exercise prescription. Introduces F.I.T.T principle from ACSM for exercise prescriptions  Reviews F.I.T.T. principles of resistance exercise including progression. Written material given at graduation.    Education: Exercise & Equipment Safety: - Individual verbal instruction and demonstration of equipment use and safety with use of the equipment. Flowsheet Row Cardiac Rehab from 11/13/2023 in [facility] Cardiac  and Pulmonary Rehab  Date 11/13/23  Educator [facility]  Instruction Review Code 1- Verbalizes Understanding       Education: Exercise Physiology & General Exercise Guidelines: - Group verbal and written instruction with models to review the exercise physiology of the cardiovascular system and associated critical values. Provides general exercise guidelines with specific guidelines to those with heart or lung disease.    Education: Flexibility, Balance, Mind/Body Relaxation: - Group verbal and visual presentation with interactive activity on the components of exercise prescription. Introduces F.I.T.T principle from ACSM for exercise prescriptions. Reviews F.I.T.T. principles of flexibility and balance exercise training including progression. Also discusses the mind body connection.  Reviews various relaxation techniques to help reduce and manage stress (i.e. Deep breathing, progressive muscle relaxation, and visualization). Balance handout provided to take home. Written material given at graduation.   Activity Barriers & Risk Stratification:  Activity Barriers & Cardiac Risk Stratification - 11/13/23 1153       Activity Barriers & Cardiac Risk Stratification   Activity Barriers None    Cardiac Risk Stratification Moderate             6 Minute Walk:  6 Minute Walk     Row Name 11/13/23 1152         6 Minute Walk   Phase Initial     Distance 1435 feet     Walk Time 6 minutes     # of Rest Breaks 0     MPH 2.71     METS 2.8     RPE 8     Perceived Dyspnea  1     VO2 Peak 9.9     Symptoms No     Resting HR 73 bpm     Resting BP 124/70     Resting Oxygen Saturation  94 %     Exercise Oxygen Saturation  during 6 min walk 99 %     Max Ex. HR 110 bpm     Max Ex. BP 142/72     2 Minute Post BP 130/70              Oxygen Initial Assessment:   Oxygen Re-Evaluation:   Oxygen Discharge (Final Oxygen Re-Evaluation):   Initial Exercise Prescription:  Initial Exercise  Prescription - 11/13/23 1100       Date of Initial Exercise RX and Referring Provider   Date 11/13/23    Referring Provider Dr. Burney Carter      Oxygen   Maintain Oxygen Saturation 88% or higher      Treadmill   MPH 2.5    Grade 1    Minutes 15    METs 3.26      NuStep   Level 3    SPM 80    Minutes 15    METs 2.8      REL-XR   Level 3    Speed 50    Minutes 15    METs 2.8      Intensity  THRR 40-80% of Max Heartrate 104-134    Ratings of Perceived Exertion 11-13    Perceived Dyspnea 0-4      Resistance Training   Training Prescription Yes    Weight 5lb    Reps 10-15             Perform Capillary Blood Glucose checks as needed.  Exercise Prescription Changes:   Exercise Prescription Changes     Row Name 11/13/23 1100             Response to Exercise   Blood Pressure (Admit) 124/70       Blood Pressure (Exercise) 142/72       Blood Pressure (Exit) 130/70       Heart Rate (Admit) 73 bpm       Heart Rate (Exercise) 110 bpm       Heart Rate (Exit) 76 bpm       Oxygen Saturation (Admit) 94 %       Oxygen Saturation (Exercise) 99 %       Oxygen Saturation (Exit) 99 %       Rating of Perceived Exertion (Exercise) 8       Perceived Dyspnea (Exercise) 1       Symptoms none       Comments results                Exercise Comments:   Exercise Goals and Review:   Exercise Goals     Row Name 11/13/23 1200             Exercise Goals   Increase Physical Activity Yes       Intervention Provide advice, education, support and counseling about physical activity/exercise needs.;Develop an individualized exercise prescription for aerobic and resistive training based on initial evaluation findings, risk stratification, comorbidities and participant's personal goals.       Expected Outcomes Long Term: Exercising regularly at least 3-5 days a week.;Long Term: Add in home exercise to make exercise part of routine and to increase amount of  physical activity.;Short Term: Attend rehab on a regular basis to increase amount of physical activity.       Increase Strength and Stamina Yes       Intervention Provide advice, education, support and counseling about physical activity/exercise needs.;Develop an individualized exercise prescription for aerobic and resistive training based on initial evaluation findings, risk stratification, comorbidities and participant's personal goals.       Expected Outcomes Long Term: Improve cardiorespiratory fitness, muscular endurance and strength as measured by increased METs and functional capacity ( );Short Term: Perform resistance training exercises routinely during rehab and add in resistance training at home;Short Term: Increase workloads from initial exercise prescription for resistance, speed, and METs.       Able to understand and use rate of perceived exertion (RPE) scale Yes       Intervention Provide education and explanation on how to use RPE scale       Expected Outcomes Long Term:  Able to use RPE to guide intensity level when exercising independently;Short Term: Able to use RPE daily in rehab to express subjective intensity level       Able to understand and use Dyspnea scale Yes       Intervention Provide education and explanation on how to use Dyspnea scale       Expected Outcomes Long Term: Able to use Dyspnea scale to guide intensity level when exercising independently;Short Term: Able to use Dyspnea scale daily in  rehab to express subjective sense of shortness of breath during exertion       Knowledge and understanding of Target Heart Rate Range (THRR) Yes       Intervention Provide education and explanation of THRR including how the numbers were predicted and where they are located for reference       Expected Outcomes Long Term: Able to use THRR to govern intensity when exercising independently;Short Term: Able to use daily as guideline for intensity in rehab;Short Term: Able to  state/look up THRR       Able to check pulse independently Yes       Intervention Review the importance of being able to check your own pulse for safety during independent exercise;Provide education and demonstration on how to check pulse in carotid and radial arteries.       Expected Outcomes Long Term: Able to check pulse independently and accurately;Short Term: Able to explain why pulse checking is important during independent exercise       Understanding of Exercise Prescription Yes       Intervention Provide education, explanation, and written materials on patient's individual exercise prescription       Expected Outcomes Long Term: Able to explain home exercise prescription to exercise independently;Short Term: Able to explain program exercise prescription                Exercise Goals Re-Evaluation :   Discharge Exercise Prescription (Final Exercise Prescription Changes):  Exercise Prescription Changes - 11/13/23 1100       Response to Exercise   Blood Pressure (Admit) 124/70    Blood Pressure (Exercise) 142/72    Blood Pressure (Exit) 130/70    Heart Rate (Admit) 73 bpm    Heart Rate (Exercise) 110 bpm    Heart Rate (Exit) 76 bpm    Oxygen Saturation (Admit) 94 %    Oxygen Saturation (Exercise) 99 %    Oxygen Saturation (Exit) 99 %    Rating of Perceived Exertion (Exercise) 8    Perceived Dyspnea (Exercise) 1    Symptoms none    Comments results             Nutrition:  Target Goals: Understanding of nutrition guidelines, daily intake of sodium 1500mg , cholesterol 200mg , calories 30% from fat and 7% or less from saturated fats, daily to have 5 or more servings of fruits and vegetables.  Education: All About Nutrition: -Group instruction provided by verbal, written material, interactive activities, discussions, models, and posters to present general guidelines for heart healthy nutrition including fat, fiber, MyPlate, the role of sodium in heart healthy  nutrition, utilization of the nutrition label, and utilization of this knowledge for meal planning. Follow up email sent as well. Written material given at graduation.   Biometrics:  Pre Biometrics - 11/13/23 1201       Pre Biometrics   Height 5' 9.76" (1.772 m)    Weight 248 lb 4.8 oz (112.6 kg)    Waist Circumference 46.5 inches    Hip Circumference 48 inches    Waist to Hip Ratio 0.97 %    BMI (Calculated) 35.87    Single Leg Stand 9.03 seconds              Nutrition Therapy Plan and Nutrition Goals:   Nutrition Assessments:  MEDIFICTS Score Key: >=70 Need to make dietary changes  40-70 Heart Healthy Diet <= 40 Therapeutic Level Cholesterol Diet   Picture Your Plate Scores: <82 Unhealthy dietary pattern with  much room for improvement. 41-50 Dietary pattern unlikely to meet recommendations for good health and room for improvement. 51-60 More healthful dietary pattern, with some room for improvement.  >60 Healthy dietary pattern, although there may be some specific behaviors that could be improved.    Nutrition Goals Re-Evaluation:   Nutrition Goals Discharge (Final Nutrition Goals Re-Evaluation):   Psychosocial: Target Goals: Acknowledge presence or absence of significant depression and/or stress, maximize coping skills, provide positive support system. Participant is able to verbalize types and ability to use techniques and skills needed for reducing stress and depression.   Education: Stress, Anxiety, and Depression - Group verbal and visual presentation to define topics covered.  Reviews how body is impacted by stress, anxiety, and depression.  Also discusses healthy ways to reduce stress and to treat/manage anxiety and depression.  Written material given at graduation.   Education: Sleep Hygiene -Provides group verbal and written instruction about how sleep can affect your health.  Define sleep hygiene, discuss sleep cycles and impact of sleep habits.  Review good sleep hygiene tips.    Initial Review & Psychosocial Screening:  Initial Psych Review & Screening - 11/11/23 1108       Initial Review   Current issues with None Identified      Family Dynamics   Good Support System? Yes    Comments He can look to his wife for support. He has two step sons he can look to for support as well,      Barriers   Psychosocial barriers to participate in program The patient should benefit from training in stress management and relaxation.;There are no identifiable barriers or psychosocial needs.      Screening Interventions   Interventions Encouraged to exercise;To provide support and resources with identified psychosocial needs;Provide feedback about the scores to participant    Expected Outcomes Short Term goal: Utilizing psychosocial counselor, staff and physician to assist with identification of specific Stressors or current issues interfering with healing process. Setting desired goal for each stressor or current issue identified.;Long Term Goal: Stressors or current issues are controlled or eliminated.;Short Term goal: Identification and review with participant of any Quality of Life or Depression concerns found by scoring the questionnaire.;Long Term goal: The participant improves quality of Life and PHQ9 Scores as seen by post scores and/or verbalization of changes             Quality of Life Scores:   Scores of 19 and below usually indicate a poorer quality of life in these areas.  A difference of  2-3 points is a clinically meaningful difference.  A difference of 2-3 points in the total score of the Quality of Life Index has been associated with significant improvement in overall quality of life, self-image, physical symptoms, and general health in studies assessing change in quality of life.  PHQ-9: Review Flowsheet       11/13/2023  Depression screen PHQ 2/9  Decreased Interest 0  Down, Depressed, Hopeless 0  PHQ - 2 Score 0   Altered sleeping 1  Tired, decreased energy 3  Change in appetite 0  Feeling bad or failure about yourself  0  Trouble concentrating 1  Moving slowly or fidgety/restless 0  Suicidal thoughts 0  PHQ-9 Score 5  Difficult doing work/chores Somewhat difficult   Interpretation of Total Score  Total Score Depression Severity:  1-4 = Minimal depression, 5-9 = Mild depression, 10-14 = Moderate depression, 15-19 = Moderately severe depression, 20-27 = Severe depression  Psychosocial Evaluation and Intervention:  Psychosocial Evaluation - 11/11/23 1109       Psychosocial Evaluation & Interventions   Interventions Relaxation education;Stress management education;Encouraged to exercise with the program and follow exercise prescription    Comments He can look to his wife for support. He has two step sons he can look to for support as well,    Expected Outcomes Short: Start [facility] to help with mood. Long: Maintain a healthy mental state    Continue Psychosocial Services  Follow up required by staff             Psychosocial Re-Evaluation:   Psychosocial Discharge (Final Psychosocial Re-Evaluation):   Vocational Rehabilitation: Provide vocational rehab assistance to qualifying candidates.   Vocational Rehab Evaluation & Intervention:   Education: Education Goals: Education classes will be provided on a variety of topics geared toward better understanding of heart health and risk factor modification. Participant will state understanding/return demonstration of topics presented as noted by education test scores.  Learning Barriers/Preferences:  Learning Barriers/Preferences - 11/11/23 1108       Learning Barriers/Preferences   Learning Barriers Sight    Learning Preferences None             General Cardiac Education Topics:  AED/CPR: - Group verbal and written instruction with the use of models to demonstrate the basic use of the AED with the basic ABC's of  resuscitation.   Anatomy and Cardiac Procedures: - Group verbal and visual presentation and models provide information about basic cardiac anatomy and function. Reviews the testing methods done to diagnose heart disease and the outcomes of the test results. Describes the treatment choices: Medical Management, Angioplasty, or Coronary Bypass Surgery for treating various heart conditions including Myocardial Infarction, Angina, Valve Disease, and Cardiac Arrhythmias.  Written material given at graduation.   Medication Safety: - Group verbal and visual instruction to review commonly prescribed medications for heart and lung disease. Reviews the medication, class of the drug, and side effects. Includes the steps to properly store meds and maintain the prescription regimen.  Written material given at graduation.   Intimacy: - Group verbal instruction through game format to discuss how heart and lung disease can affect sexual intimacy. Written material given at graduation..   Know Your Numbers and Heart Failure: - Group verbal and visual instruction to discuss disease risk factors for cardiac and pulmonary disease and treatment options.  Reviews associated critical values for Overweight/Obesity, Hypertension, Cholesterol, and Diabetes.  Discusses basics of heart failure: signs/symptoms and treatments.  Introduces Heart Failure Zone chart for action plan for heart failure.  Written material given at graduation.   Infection Prevention: - Provides verbal and written material to individual with discussion of infection control including proper hand washing and proper equipment cleaning during exercise session. Flowsheet Row Cardiac Rehab from 11/13/2023 in [facility] Cardiac and Pulmonary Rehab  Date 11/13/23  Educator [facility]  Instruction Review Code 1- Verbalizes Understanding       Falls Prevention: - Provides verbal and written material to individual with discussion of falls prevention and  safety. Flowsheet Row Cardiac Rehab from 11/13/2023 in [facility] Cardiac and Pulmonary Rehab  Date 11/13/23  Educator [facility]  Instruction Review Code 1- Verbalizes Understanding       Other: -Provides group and verbal instruction on various topics (see comments)   Knowledge Questionnaire Score:   Core Components/Risk Factors/Patient Goals at Admission:  Personal Goals and Risk Factors at Admission - 11/11/23 1108       Core  Components/Risk Factors/Patient Goals on Admission    Weight Management Yes;Weight Loss    Intervention Weight Management: Develop a combined nutrition and exercise program designed to reach desired caloric intake, while maintaining appropriate intake of nutrient and fiber, sodium and fats, and appropriate energy expenditure required for the weight goal.;Weight Management: Provide education and appropriate resources to help participant work on and attain dietary goals.;Weight Management/Obesity: Establish reasonable short term and long term weight goals.    Expected Outcomes Short Term: Continue to assess and modify interventions until short term weight is achieved;Long Term: Adherence to nutrition and physical activity/exercise program aimed toward attainment of established weight goal;Weight Loss: Understanding of general recommendations for a balanced deficit meal plan, which promotes 1-2 lb weight loss per week and includes a negative energy balance of [phone] kcal/d;Understanding recommendations for meals to include 15-35% energy as protein, 25-35% energy from fat, 35-60% energy from carbohydrates, less than 200mg  of dietary cholesterol, 20-35 gm of total fiber daily;Understanding of distribution of calorie intake throughout the day with the consumption of 4-5 meals/snacks    Hypertension Yes    Intervention Provide education on lifestyle modifcations including regular physical activity/exercise, weight management, moderate sodium restriction and increased consumption of  fresh fruit, vegetables, and low fat dairy, alcohol moderation, and smoking cessation.;Monitor prescription use compliance.    Expected Outcomes Short Term: Continued assessment and intervention until BP is < 140/53mm HG in hypertensive participants. < 130/73mm HG in hypertensive participants with diabetes, heart failure or chronic kidney disease.;Long Term: Maintenance of blood pressure at goal levels.    Lipids Yes    Intervention Provide education and support for participant on nutrition & aerobic/resistive exercise along with prescribed medications to achieve LDL 70mg , HDL >40mg .    Expected Outcomes Long Term: Cholesterol controlled with medications as prescribed, with individualized exercise RX and with personalized nutrition plan. Value goals: LDL < 70mg , HDL > 40 mg.;Short Term: Participant states understanding of desired cholesterol values and is compliant with medications prescribed. Participant is following exercise prescription and nutrition guidelines.             Education:Diabetes - Individual verbal and written instruction to review signs/symptoms of diabetes, desired ranges of glucose level fasting, after meals and with exercise. Acknowledge that pre and post exercise glucose checks will be done for 3 sessions at entry of program.   Core Components/Risk Factors/Patient Goals Review:    Core Components/Risk Factors/Patient Goals at Discharge (Final Review):    ITP Comments:  ITP Comments     Row Name 11/11/23 1111 11/13/23 1152         ITP Comments Virtual Visit completed. Patient informed on EP and RD appointment and 6 Minute walk test. Patient also informed of patient health questionnaires on My Chart. Patient Verbalizes understanding. Visit diagnosis can be found in [facility] 08/31/2023. Completed and gym orientation. Initial ITP created and sent for review to Dr. Firman Hughes, Medical Director.               Comments: Initial ITP

## 2023-11-13 NOTE — Patient Instructions (Signed)
 Patient Instructions  Patient Details  Name: Cody Golden MRN: [id number] Date of Birth: 12-31-1955 Referring Provider:  Antonette Batters, MD  Below are your personal goals for exercise, nutrition, and risk factors. Our goal is to help you stay on track towards obtaining and maintaining these goals. We will be discussing your progress on these goals with you throughout the program.  Initial Exercise Prescription:  Initial Exercise Prescription - 11/13/23 1100       Date of Initial Exercise RX and Referring Provider   Date 11/13/23    Referring Provider Dr. Burney Carter      Oxygen   Maintain Oxygen Saturation 88% or higher      Treadmill   MPH 2.5    Grade 1    Minutes 15    METs 3.26      NuStep   Level 3    SPM 80    Minutes 15    METs 2.8      REL-XR   Level 3    Speed 50    Minutes 15    METs 2.8      Intensity   THRR 40-80% of Max Heartrate 104-134    Ratings of Perceived Exertion 11-13    Perceived Dyspnea 0-4      Resistance Training   Training Prescription Yes    Weight 5lb    Reps 10-15             Exercise Goals: Frequency: Be able to perform aerobic exercise two to three times per week in program working toward 2-5 days per week of home exercise.  Intensity: Work with a perceived exertion of 11 (fairly light) - 15 (hard) while following your exercise prescription.  We will make changes to your prescription with you as you progress through the program.   Duration: Be able to do 30 to 45 minutes of continuous aerobic exercise in addition to a 5 minute warm-up and a 5 minute cool-down routine.   Nutrition Goals: Your personal nutrition goals will be established when you do your nutrition analysis with the dietician.  The following are general nutrition guidelines to follow: Cholesterol < 200mg /day Sodium < 1500mg /day Fiber: Men over 50 yrs - 30 grams per day  Personal Goals:  Personal Goals and Risk Factors at Admission - 11/11/23  1108       Core Components/Risk Factors/Patient Goals on Admission    Weight Management Yes;Weight Loss    Intervention Weight Management: Develop a combined nutrition and exercise program designed to reach desired caloric intake, while maintaining appropriate intake of nutrient and fiber, sodium and fats, and appropriate energy expenditure required for the weight goal.;Weight Management: Provide education and appropriate resources to help participant work on and attain dietary goals.;Weight Management/Obesity: Establish reasonable short term and long term weight goals.    Expected Outcomes Short Term: Continue to assess and modify interventions until short term weight is achieved;Long Term: Adherence to nutrition and physical activity/exercise program aimed toward attainment of established weight goal;Weight Loss: Understanding of general recommendations for a balanced deficit meal plan, which promotes 1-2 lb weight loss per week and includes a negative energy balance of [phone] kcal/d;Understanding recommendations for meals to include 15-35% energy as protein, 25-35% energy from fat, 35-60% energy from carbohydrates, less than 200mg  of dietary cholesterol, 20-35 gm of total fiber daily;Understanding of distribution of calorie intake throughout the day with the consumption of 4-5 meals/snacks    Hypertension Yes    Intervention Provide education  on lifestyle modifcations including regular physical activity/exercise, weight management, moderate sodium restriction and increased consumption of fresh fruit, vegetables, and low fat dairy, alcohol moderation, and smoking cessation.;Monitor prescription use compliance.    Expected Outcomes Short Term: Continued assessment and intervention until BP is < 140/107mm HG in hypertensive participants. < 130/84mm HG in hypertensive participants with diabetes, heart failure or chronic kidney disease.;Long Term: Maintenance of blood pressure at goal levels.    Lipids Yes     Intervention Provide education and support for participant on nutrition & aerobic/resistive exercise along with prescribed medications to achieve LDL 70mg , HDL >40mg .    Expected Outcomes Long Term: Cholesterol controlled with medications as prescribed, with individualized exercise RX and with personalized nutrition plan. Value goals: LDL < 70mg , HDL > 40 mg.;Short Term: Participant states understanding of desired cholesterol values and is compliant with medications prescribed. Participant is following exercise prescription and nutrition guidelines.             Tobacco Use Initial Evaluation: Social History   Tobacco Use  Smoking Status Never  Smokeless Tobacco Never    Exercise Goals and Review:  Exercise Goals     Row Name 11/13/23 1200             Exercise Goals   Increase Physical Activity Yes       Intervention Provide advice, education, support and counseling about physical activity/exercise needs.;Develop an individualized exercise prescription for aerobic and resistive training based on initial evaluation findings, risk stratification, comorbidities and participant's personal goals.       Expected Outcomes Long Term: Exercising regularly at least 3-5 days a week.;Long Term: Add in home exercise to make exercise part of routine and to increase amount of physical activity.;Short Term: Attend rehab on a regular basis to increase amount of physical activity.       Increase Strength and Stamina Yes       Intervention Provide advice, education, support and counseling about physical activity/exercise needs.;Develop an individualized exercise prescription for aerobic and resistive training based on initial evaluation findings, risk stratification, comorbidities and participant's personal goals.       Expected Outcomes Long Term: Improve cardiorespiratory fitness, muscular endurance and strength as measured by increased METs and functional capacity ( );Short Term: Perform  resistance training exercises routinely during rehab and add in resistance training at home;Short Term: Increase workloads from initial exercise prescription for resistance, speed, and METs.       Able to understand and use rate of perceived exertion (RPE) scale Yes       Intervention Provide education and explanation on how to use RPE scale       Expected Outcomes Long Term:  Able to use RPE to guide intensity level when exercising independently;Short Term: Able to use RPE daily in rehab to express subjective intensity level       Able to understand and use Dyspnea scale Yes       Intervention Provide education and explanation on how to use Dyspnea scale       Expected Outcomes Long Term: Able to use Dyspnea scale to guide intensity level when exercising independently;Short Term: Able to use Dyspnea scale daily in rehab to express subjective sense of shortness of breath during exertion       Knowledge and understanding of Target Heart Rate Range (THRR) Yes       Intervention Provide education and explanation of THRR including how the numbers were predicted and where they are located for reference  Expected Outcomes Long Term: Able to use THRR to govern intensity when exercising independently;Short Term: Able to use daily as guideline for intensity in rehab;Short Term: Able to state/look up THRR       Able to check pulse independently Yes       Intervention Review the importance of being able to check your own pulse for safety during independent exercise;Provide education and demonstration on how to check pulse in carotid and radial arteries.       Expected Outcomes Long Term: Able to check pulse independently and accurately;Short Term: Able to explain why pulse checking is important during independent exercise       Understanding of Exercise Prescription Yes       Intervention Provide education, explanation, and written materials on patient's individual exercise prescription       Expected  Outcomes Long Term: Able to explain home exercise prescription to exercise independently;Short Term: Able to explain program exercise prescription                Copy of goals given to participant.

## 2023-11-19 DIAGNOSIS — I214 Non-ST elevation (NSTEMI) myocardial infarction: Secondary | ICD-10-CM

## 2023-11-19 NOTE — Progress Notes (Signed)
Daily Session Note  Patient Details  Name: Cody Golden MRN: [id number] Date of Birth: Jul 29, 1956 Referring Provider:   Flowsheet Row Cardiac Rehab from 11/13/2023 in [facility] Cardiac and Pulmonary Rehab  Referring Provider Dr. Dorothyann Peng       Encounter Date: 11/19/2023  Check In:  Session Check In - 11/19/23 1105       Check-In   Supervising physician immediately available to respond to emergencies See telemetry face sheet for immediately available ER MD    Location [facility]-Cardiac & Pulmonary Rehab    Staff Present Kelton Pillar RN,BSN,MPA;Jason Wallace Cullens RDN,LDN;Maxon Conetta BS, Exercise Physiologist;Meredith Jewel Baize RN,BSN    Virtual Visit No    Medication changes reported     No    Fall or balance concerns reported    No    Tobacco Cessation No Change    Warm-up and Cool-down Performed on first and last piece of equipment    Resistance Training Performed Yes    VAD Patient? No    PAD/SET Patient? No      Pain Assessment   Currently in Pain? No/denies                Social History   Tobacco Use  Smoking Status Never  Smokeless Tobacco Never    Goals Met:  Independence with exercise equipment Exercise tolerated well No report of concerns or symptoms today Strength training completed today  Goals Unmet:  Not Applicable  Comments: First full day of exercise!  Patient was oriented to gym and equipment including functions, settings, policies, and procedures.  Patient's individual exercise prescription and treatment plan were reviewed.  All starting workloads were established based on the results of the 6 minute walk test done at initial orientation visit.  The plan for exercise progression was also introduced and progression will be customized based on patient's performance and goals.    Dr. Bethann Punches is Medical Director for [facility] Cardiac Rehabilitation.  Dr. Vida Rigger is Medical Director for [facility] Pulmonary Rehabilitation.

## 2023-11-21 ENCOUNTER — Encounter: Payer: Medicare PPO | Admitting: *Deleted

## 2023-11-21 DIAGNOSIS — I214 Non-ST elevation (NSTEMI) myocardial infarction: Secondary | ICD-10-CM

## 2023-11-21 NOTE — Progress Notes (Signed)
Daily Session Note  Patient Details  Name: Cody Golden MRN: [id number] Date of Birth: Feb 27, 1956 Referring Provider:   Flowsheet Row Cardiac Rehab from 11/13/2023 in [facility] Cardiac and Pulmonary Rehab  Referring Provider Dr. Dorothyann Peng       Encounter Date: 11/21/2023  Check In:  Session Check In - 11/21/23 1352       Check-In   Supervising physician immediately available to respond to emergencies See telemetry face sheet for immediately available ER MD    Location [facility]-Cardiac & Pulmonary Rehab    Staff Present Susann Givens RN,BSN;Susanne Bice, RN, BSN, CCRP;Maxon Conetta BS, Exercise Physiologist;Noah Tickle, BS, Exercise Physiologist    Virtual Visit No    Medication changes reported     No    Fall or balance concerns reported    No    Warm-up and Cool-down Performed on first and last piece of equipment    Resistance Training Performed Yes    VAD Patient? No    PAD/SET Patient? No      Pain Assessment   Currently in Pain? No/denies                Social History   Tobacco Use  Smoking Status Never  Smokeless Tobacco Never    Goals Met:  Independence with exercise equipment Exercise tolerated well No report of concerns or symptoms today Strength training completed today  Goals Unmet:  Not Applicable  Comments: Pt able to follow exercise prescription today without complaint.  Will continue to monitor for progression.    Dr. Bethann Punches is Medical Director for [facility] Cardiac Rehabilitation.  Dr. Vida Rigger is Medical Director for [facility] Pulmonary Rehabilitation.

## 2023-11-26 ENCOUNTER — Encounter: Payer: Medicare PPO | Admitting: *Deleted

## 2023-11-26 DIAGNOSIS — I214 Non-ST elevation (NSTEMI) myocardial infarction: Secondary | ICD-10-CM | POA: Diagnosis not present

## 2023-11-26 NOTE — Progress Notes (Signed)
Daily Session Note  Patient Details  Name: Cody Golden MRN: [id number] Date of Birth: Dec 25, 1955 Referring Provider:   Flowsheet Row Cardiac Rehab from 11/13/2023 in [facility] Cardiac and Pulmonary Rehab  Referring Provider Dr. Dorothyann Peng       Encounter Date: 11/26/2023  Check In:  Session Check In - 11/26/23 1129       Check-In   Supervising physician immediately available to respond to emergencies See telemetry face sheet for immediately available ER MD    Location [facility]-Cardiac & Pulmonary Rehab    Staff Present Rory Percy, MS, Exercise Physiologist;Maxon Conetta BS, Exercise Physiologist;Lynnetta Tom, RN, BSN, CCRP;Meredith Craven RN,BSN;Noah Tickle, BS, Exercise Physiologist;Jason Wallace Cullens RDN,LDN    Virtual Visit No    Medication changes reported     No    Fall or balance concerns reported    No    Warm-up and Cool-down Performed on first and last piece of equipment    Resistance Training Performed Yes    VAD Patient? No    PAD/SET Patient? No      Pain Assessment   Currently in Pain? No/denies                Social History   Tobacco Use  Smoking Status Never  Smokeless Tobacco Never    Goals Met:  Independence with exercise equipment Exercise tolerated well No report of concerns or symptoms today  Goals Unmet:  Not Applicable  Comments: Pt able to follow exercise prescription today without complaint.  Will continue to monitor for progression.    Dr. Bethann Punches is Medical Director for [facility] Cardiac Rehabilitation.  Dr. Vida Rigger is Medical Director for [facility] Pulmonary Rehabilitation.

## 2023-11-28 ENCOUNTER — Encounter: Payer: Medicare PPO | Admitting: *Deleted

## 2023-11-28 DIAGNOSIS — I214 Non-ST elevation (NSTEMI) myocardial infarction: Secondary | ICD-10-CM

## 2023-11-28 NOTE — Progress Notes (Signed)
Daily Session Note  Patient Details  Name: Cody Golden MRN: [id number] Date of Birth: [birth year] Referring Provider:   Flowsheet Row Cardiac Rehab from 11/13/2023 in [facility] Cardiac and Pulmonary Rehab  Referring Provider Dr. Dorothyann Peng       Encounter Date: 11/28/2023  Check In:  Session Check In - 11/28/23 1339       Check-In   Supervising physician immediately available to respond to emergencies See telemetry face sheet for immediately available ER MD    Location [facility]-Cardiac & Pulmonary Rehab    Staff Present Susann Givens RN,BSN;[facility] BS, Exercise Physiologist    Virtual Visit No    Medication changes reported     No    Fall or balance concerns reported    No    Warm-up and Cool-down Performed on first and last piece of equipment    Resistance Training Performed Yes    VAD Patient? No    PAD/SET Patient? No      Pain Assessment   Currently in Pain? No/denies                Social History   Tobacco Use  Smoking Status Never  Smokeless Tobacco Never    Goals Met:  Independence with exercise equipment Exercise tolerated well No report of concerns or symptoms today Strength training completed today  Goals Unmet:  Not Applicable  Comments: Pt able to follow exercise prescription today without complaint.  Will continue to monitor for progression.    Dr. Bethann Punches is Medical Director for [facility] Cardiac Rehabilitation.  Dr. Vida Rigger is Medical Director for [facility] Pulmonary Rehabilitation.

## 2023-12-02 ENCOUNTER — Telehealth: Payer: Self-pay | Admitting: *Deleted

## 2023-12-02 NOTE — Telephone Encounter (Signed)
Cody Golden is starting a new job and will no longer be able to attend the times he is currently signed up for. He will not be here this week. The 7:30 am and 5:15 pm class were offered as an option if they will work with his new work schedule. He is going to check with his new employer this week when he starts to see if that would be an option. He stated he would call us by the end of the week to let us know if he can continue with the program at a new time or if he will need to be discharged.

## 2023-12-05 ENCOUNTER — Encounter: Payer: Self-pay | Admitting: *Deleted

## 2023-12-05 DIAGNOSIS — I214 Non-ST elevation (NSTEMI) myocardial infarction: Secondary | ICD-10-CM

## 2023-12-05 NOTE — Progress Notes (Signed)
 Cardiac Individual Treatment Plan  Patient Details  Name: Cody Golden MRN: [id number] Date of Birth: 08-21-1956 Referring Provider:   Flowsheet Row Cardiac Rehab from 11/13/2023 in [facility] Cardiac and Pulmonary Rehab  Referring Provider Dr. Cara Lovelace       Initial Encounter Date:  Flowsheet Row Cardiac Rehab from 11/13/2023 in [facility] Cardiac and Pulmonary Rehab  Date 11/13/23       Visit Diagnosis: NSTEMI (non-ST elevated myocardial infarction) [facility])  Patient's Home Medications on Admission:  Current Outpatient Medications:    aspirin  81 MG chewable tablet, Chew 81 mg by mouth daily., Disp: , Rfl:    atorvastatin  (LIPITOR) 40 MG tablet, Take 1 tablet (40 mg total) by mouth daily., Disp: 30 tablet, Rfl: 0   carvedilol  (COREG ) 6.25 MG tablet, Take 1 tablet (6.25 mg total) by mouth 2 (two) times daily with a meal., Disp: 60 tablet, Rfl: 0   clopidogrel  (PLAVIX ) 75 MG tablet, Take 1 tablet (75 mg total) by mouth daily with breakfast., Disp: 30 tablet, Rfl: 0   clopidogrel  (PLAVIX ) 75 MG tablet, Take by mouth. (Patient not taking: Reported on 11/11/2023), Disp: , Rfl:    co-enzyme Q-10 30 MG capsule, Take 30 mg by mouth daily., Disp: , Rfl:    fluticasone  (FLONASE ) 50 MCG/ACT nasal spray, Place into the nose. (Patient not taking: Reported on 11/11/2023), Disp: , Rfl:    fluticasone  (FLONASE ) 50 MCG/ACT nasal spray, Place into the nose. (Patient not taking: Reported on 11/11/2023), Disp: , Rfl:    latanoprost  (XALATAN ) 0.005 % ophthalmic solution, Place 1 drop into both eyes at bedtime., Disp: , Rfl:    losartan  (COZAAR ) 50 MG tablet, Take 50 mg by mouth daily. (Patient not taking: Reported on 11/11/2023), Disp: , Rfl:    losartan  (COZAAR ) 50 MG tablet, Take 1 tablet by mouth daily., Disp: , Rfl:    Multiple Vitamin (MULTIVITAMIN) tablet, Take 1 tablet by mouth daily., Disp: , Rfl:    timolol  (TIMOPTIC ) 0.5 % ophthalmic solution, Place 1 drop into both eyes every morning., Disp: , Rfl:     tiZANidine  (ZANAFLEX ) 4 MG tablet, Take 4 mg by mouth every 8 (eight) hours. (Patient not taking: Reported on 11/11/2023), Disp: , Rfl:   Past Medical History: Past Medical History:  Diagnosis Date   Arthritis    Atrial fibrillation (HCC)    chronic   COVID-19 11/02/2019   Dysrhythmia    A-fib    Tobacco Use: Social History   Tobacco Use  Smoking Status Never  Smokeless Tobacco Never    Labs: Review Flowsheet       Latest Ref Rng & Units 09/02/2023  Labs for ITP Cardiac and Pulmonary Rehab  Cholestrol 0 - 200 mg/dL 815   LDL (calc) 0 - 99 mg/dL 878   HDL-C >59 mg/dL 49   Trlycerides <849 mg/dL 68   Hemoglobin J8r 4.8 - 5.6 % 5.9      Exercise Target Goals: Exercise Program Goal: Individual exercise prescription set using results from initial 6 min walk test and THRR while considering  patient's activity barriers and safety.   Exercise Prescription Goal: Initial exercise prescription builds to 30-45 minutes a day of aerobic activity, 2-3 days per week.  Home exercise guidelines will be given to patient during program as part of exercise prescription that the participant will acknowledge.   Education: Aerobic Exercise: - Group verbal and visual presentation on the components of exercise prescription. Introduces F.I.T.T principle from ACSM for exercise prescriptions.  Reviews  F.I.T.T. principles of aerobic exercise including progression. Written material given at graduation. Flowsheet Row Cardiac Rehab from 11/21/2023 in [facility] Cardiac and Pulmonary Rehab  Education need identified 11/21/23       Education: Resistance Exercise: - Group verbal and visual presentation on the components of exercise prescription. Introduces F.I.T.T principle from ACSM for exercise prescriptions  Reviews F.I.T.T. principles of resistance exercise including progression. Written material given at graduation.    Education: Exercise & Equipment Safety: - Individual verbal instruction and  demonstration of equipment use and safety with use of the equipment. Flowsheet Row Cardiac Rehab from 11/13/2023 in [facility] Cardiac and Pulmonary Rehab  Date 11/13/23  Educator [facility]  Instruction Review Code 1- Verbalizes Understanding       Education: Exercise Physiology & General Exercise Guidelines: - Group verbal and written instruction with models to review the exercise physiology of the cardiovascular system and associated critical values. Provides general exercise guidelines with specific guidelines to those with heart or lung disease.    Education: Flexibility, Balance, Mind/Body Relaxation: - Group verbal and visual presentation with interactive activity on the components of exercise prescription. Introduces F.I.T.T principle from ACSM for exercise prescriptions. Reviews F.I.T.T. principles of flexibility and balance exercise training including progression. Also discusses the mind body connection.  Reviews various relaxation techniques to help reduce and manage stress (i.e. Deep breathing, progressive muscle relaxation, and visualization). Balance handout provided to take home. Written material given at graduation.   Activity Barriers & Risk Stratification:  Activity Barriers & Cardiac Risk Stratification - 11/13/23 1153       Activity Barriers & Cardiac Risk Stratification   Activity Barriers None    Cardiac Risk Stratification Moderate             6 Minute Walk:  6 Minute Walk     Row Name 11/13/23 1152         6 Minute Walk   Phase Initial     Distance 1435 feet     Walk Time 6 minutes     # of Rest Breaks 0     MPH 2.71     METS 2.8     RPE 8     Perceived Dyspnea  1     VO2 Peak 9.9     Symptoms No     Resting HR 73 bpm     Resting BP 124/70     Resting Oxygen Saturation  94 %     Exercise Oxygen Saturation  during 6 min walk 99 %     Max Ex. HR 110 bpm     Max Ex. BP 142/72     2 Minute Post BP 130/70              Oxygen Initial  Assessment:   Oxygen Re-Evaluation:   Oxygen Discharge (Final Oxygen Re-Evaluation):   Initial Exercise Prescription:  Initial Exercise Prescription - 11/13/23 1100       Date of Initial Exercise RX and Referring Provider   Date 11/13/23    Referring Provider Dr. Cara Lovelace      Oxygen   Maintain Oxygen Saturation 88% or higher      Treadmill   MPH 2.5    Grade 1    Minutes 15    METs 3.26      NuStep   Level 3    SPM 80    Minutes 15    METs 2.8      REL-XR   Level 3  Speed 50    Minutes 15    METs 2.8      Intensity   THRR 40-80% of Max Heartrate 104-134    Ratings of Perceived Exertion 11-13    Perceived Dyspnea 0-4      Resistance Training   Training Prescription Yes    Weight 5lb    Reps 10-15             Perform Capillary Blood Glucose checks as needed.  Exercise Prescription Changes:   Exercise Prescription Changes     Row Name 11/13/23 1100 12/02/23 1500           Response to Exercise   Blood Pressure (Admit) 124/70 132/76      Blood Pressure (Exercise) 142/72 154/84      Blood Pressure (Exit) 130/70 124/82      Heart Rate (Admit) 73 bpm 63 bpm      Heart Rate (Exercise) 110 bpm 138 bpm      Heart Rate (Exit) 76 bpm 83 bpm      Oxygen Saturation (Admit) 94 % --      Oxygen Saturation (Exercise) 99 % --      Oxygen Saturation (Exit) 99 % --      Rating of Perceived Exertion (Exercise) 8 13      Perceived Dyspnea (Exercise) 1 --      Symptoms none none      Comments results --      Duration -- Progress to 30 minutes of  aerobic without signs/symptoms of physical distress      Intensity -- THRR unchanged        Progression   Progression -- Continue to progress workloads to maintain intensity without signs/symptoms of physical distress.      Average METs -- 3.37        Resistance Training   Training Prescription -- Yes      Weight -- 5lb      Reps -- 10-15        Interval Training   Interval Training -- No         Treadmill   MPH -- 2.6      Grade -- 1      Minutes -- 15      METs -- 3.35        NuStep   Level -- 5      Minutes -- 15      METs -- 4.2        REL-XR   Level -- 2      Minutes -- 15      METs -- 3.4        Oxygen   Maintain Oxygen Saturation -- 88% or higher               Exercise Comments:   Exercise Comments     Row Name 11/19/23 1106           Exercise Comments First full day of exercise!  Patient was oriented to gym and equipment including functions, settings, policies, and procedures.  Patient's individual exercise prescription and treatment plan were reviewed.  All starting workloads were established based on the results of the 6 minute walk test done at initial orientation visit.  The plan for exercise progression was also introduced and progression will be customized based on patient's performance and goals.                Exercise Goals and Review:   Exercise  Goals     Row Name 11/13/23 1200             Exercise Goals   Increase Physical Activity Yes       Intervention Provide advice, education, support and counseling about physical activity/exercise needs.;Develop an individualized exercise prescription for aerobic and resistive training based on initial evaluation findings, risk stratification, comorbidities and participant's personal goals.       Expected Outcomes Long Term: Exercising regularly at least 3-5 days a week.;Long Term: Add in home exercise to make exercise part of routine and to increase amount of physical activity.;Short Term: Attend rehab on a regular basis to increase amount of physical activity.       Increase Strength and Stamina Yes       Intervention Provide advice, education, support and counseling about physical activity/exercise needs.;Develop an individualized exercise prescription for aerobic and resistive training based on initial evaluation findings, risk stratification, comorbidities and participant's personal  goals.       Expected Outcomes Long Term: Improve cardiorespiratory fitness, muscular endurance and strength as measured by increased METs and functional capacity ( );Short Term: Perform resistance training exercises routinely during rehab and add in resistance training at home;Short Term: Increase workloads from initial exercise prescription for resistance, speed, and METs.       Able to understand and use rate of perceived exertion (RPE) scale Yes       Intervention Provide education and explanation on how to use RPE scale       Expected Outcomes Long Term:  Able to use RPE to guide intensity level when exercising independently;Short Term: Able to use RPE daily in rehab to express subjective intensity level       Able to understand and use Dyspnea scale Yes       Intervention Provide education and explanation on how to use Dyspnea scale       Expected Outcomes Long Term: Able to use Dyspnea scale to guide intensity level when exercising independently;Short Term: Able to use Dyspnea scale daily in rehab to express subjective sense of shortness of breath during exertion       Knowledge and understanding of Target Heart Rate Range (THRR) Yes       Intervention Provide education and explanation of THRR including how the numbers were predicted and where they are located for reference       Expected Outcomes Long Term: Able to use THRR to govern intensity when exercising independently;Short Term: Able to use daily as guideline for intensity in rehab;Short Term: Able to state/look up THRR       Able to check pulse independently Yes       Intervention Review the importance of being able to check your own pulse for safety during independent exercise;Provide education and demonstration on how to check pulse in carotid and radial arteries.       Expected Outcomes Long Term: Able to check pulse independently and accurately;Short Term: Able to explain why pulse checking is important during independent exercise        Understanding of Exercise Prescription Yes       Intervention Provide education, explanation, and written materials on patient's individual exercise prescription       Expected Outcomes Long Term: Able to explain home exercise prescription to exercise independently;Short Term: Able to explain program exercise prescription                Exercise Goals Re-Evaluation :  Exercise Goals Re-Evaluation  Row Name 11/19/23 1106 12/02/23 1506           Exercise Goal Re-Evaluation   Exercise Goals Review Able to understand and use rate of perceived exertion (RPE) scale;Increase Physical Activity;Knowledge and understanding of Target Heart Rate Range (THRR);Understanding of Exercise Prescription;Increase Strength and Stamina;Able to understand and use Dyspnea scale;Able to check pulse independently Increase Physical Activity;Understanding of Exercise Prescription;Increase Strength and Stamina      Comments Reviewed RPE and dyspnea scale, THR and program prescription with pt today.  Pt voiced understanding and was given a copy of goals to take home. JW is off to a good start in the program. He has tolerated his exercise prescription well in his first 2 weeks. He increased to level 5 on the T4 and increased his speed on the treadmill to 2. with an incline of 1%. He has tolerated level 2 on the XR and 5 lb handweights. We will continue to monitor his progress in the program.      Expected Outcomes Short: Use RPE daily to regulate intensity.  Long: Follow program prescription in THR. Short: Continue to follow current exercise prescription. Long: Continue exercise to improve strength and stamina.               Discharge Exercise Prescription (Final Exercise Prescription Changes):  Exercise Prescription Changes - 12/02/23 1500       Response to Exercise   Blood Pressure (Admit) 132/76    Blood Pressure (Exercise) 154/84    Blood Pressure (Exit) 124/82    Heart Rate (Admit) 63 bpm     Heart Rate (Exercise) 138 bpm    Heart Rate (Exit) 83 bpm    Rating of Perceived Exertion (Exercise) 13    Symptoms none    Duration Progress to 30 minutes of  aerobic without signs/symptoms of physical distress    Intensity THRR unchanged      Progression   Progression Continue to progress workloads to maintain intensity without signs/symptoms of physical distress.    Average METs 3.37      Resistance Training   Training Prescription Yes    Weight 5lb    Reps 10-15      Interval Training   Interval Training No      Treadmill   MPH 2.6    Grade 1    Minutes 15    METs 3.35      NuStep   Level 5    Minutes 15    METs 4.2      REL-XR   Level 2    Minutes 15    METs 3.4      Oxygen   Maintain Oxygen Saturation 88% or higher             Nutrition:  Target Goals: Understanding of nutrition guidelines, daily intake of sodium 1500mg , cholesterol 200mg , calories 30% from fat and 7% or less from saturated fats, daily to have 5 or more servings of fruits and vegetables.  Education: All About Nutrition: -Group instruction provided by verbal, written material, interactive activities, discussions, models, and posters to present general guidelines for heart healthy nutrition including fat, fiber, MyPlate, the role of sodium in heart healthy nutrition, utilization of the nutrition label, and utilization of this knowledge for meal planning. Follow up email sent as well. Written material given at graduation.   Biometrics:  Pre Biometrics - 11/13/23 1201       Pre Biometrics   Height 5' 9.76 (1.772 m)  Weight 248 lb 4.8 oz (112.6 kg)    Waist Circumference 46.5 inches    Hip Circumference 48 inches    Waist to Hip Ratio 0.97 %    BMI (Calculated) 35.87    Single Leg Stand 9.03 seconds              Nutrition Therapy Plan and Nutrition Goals:   Nutrition Assessments:  MEDIFICTS Score Key: >=70 Need to make dietary changes  40-70 Heart Healthy Diet <= 40  Therapeutic Level Cholesterol Diet  Flowsheet Row Cardiac Rehab from 11/21/2023 in [facility] Cardiac and Pulmonary Rehab  Picture Your Plate Total Score on Admission 49      Picture Your Plate Scores: <59 Unhealthy dietary pattern with much room for improvement. 41-50 Dietary pattern unlikely to meet recommendations for good health and room for improvement. 51-60 More healthful dietary pattern, with some room for improvement.  >60 Healthy dietary pattern, although there may be some specific behaviors that could be improved.    Nutrition Goals Re-Evaluation:   Nutrition Goals Discharge (Final Nutrition Goals Re-Evaluation):   Psychosocial: Target Goals: Acknowledge presence or absence of significant depression and/or stress, maximize coping skills, provide positive support system. Participant is able to verbalize types and ability to use techniques and skills needed for reducing stress and depression.   Education: Stress, Anxiety, and Depression - Group verbal and visual presentation to define topics covered.  Reviews how body is impacted by stress, anxiety, and depression.  Also discusses healthy ways to reduce stress and to treat/manage anxiety and depression.  Written material given at graduation.   Education: Sleep Hygiene -Provides group verbal and written instruction about how sleep can affect your health.  Define sleep hygiene, discuss sleep cycles and impact of sleep habits. Review good sleep hygiene tips.    Initial Review & Psychosocial Screening:  Initial Psych Review & Screening - 11/11/23 1108       Initial Review   Current issues with None Identified      Family Dynamics   Good Support System? Yes    Comments He can look to his wife for support. He has two step sons he can look to for support as well,      Barriers   Psychosocial barriers to participate in program The patient should benefit from training in stress management and relaxation.;There are no identifiable  barriers or psychosocial needs.      Screening Interventions   Interventions Encouraged to exercise;To provide support and resources with identified psychosocial needs;Provide feedback about the scores to participant    Expected Outcomes Short Term goal: Utilizing psychosocial counselor, staff and physician to assist with identification of specific Stressors or current issues interfering with healing process. Setting desired goal for each stressor or current issue identified.;Long Term Goal: Stressors or current issues are controlled or eliminated.;Short Term goal: Identification and review with participant of any Quality of Life or Depression concerns found by scoring the questionnaire.;Long Term goal: The participant improves quality of Life and PHQ9 Scores as seen by post scores and/or verbalization of changes             Quality of Life Scores:   Quality of Life - 11/13/23 1318       Quality of Life   Select Quality of Life      Quality of Life Scores   Health/Function Pre 24.37 %    Socioeconomic Pre 25.13 %    Psych/Spiritual Pre 25.21 %    Family Pre 28.8 %  GLOBAL Pre 25.34 %            Scores of 19 and below usually indicate a poorer quality of life in these areas.  A difference of  2-3 points is a clinically meaningful difference.  A difference of 2-3 points in the total score of the Quality of Life Index has been associated with significant improvement in overall quality of life, self-image, physical symptoms, and general health in studies assessing change in quality of life.  PHQ-9: Review Flowsheet       11/13/2023  Depression screen PHQ 2/9  Decreased Interest 0  Down, Depressed, Hopeless 0  PHQ - 2 Score 0  Altered sleeping 1  Tired, decreased energy 3  Change in appetite 0  Feeling bad or failure about yourself  0  Trouble concentrating 1  Moving slowly or fidgety/restless 0  Suicidal thoughts 0  PHQ-9 Score 5  Difficult doing work/chores Somewhat  difficult   Interpretation of Total Score  Total Score Depression Severity:  1-4 = Minimal depression, 5-9 = Mild depression, 10-14 = Moderate depression, 15-19 = Moderately severe depression, 20-27 = Severe depression   Psychosocial Evaluation and Intervention:  Psychosocial Evaluation - 11/11/23 1109       Psychosocial Evaluation & Interventions   Interventions Relaxation education;Stress management education;Encouraged to exercise with the program and follow exercise prescription    Comments He can look to his wife for support. He has two step sons he can look to for support as well,    Expected Outcomes Short: Start HeartTrack to help with mood. Long: Maintain a healthy mental state    Continue Psychosocial Services  Follow up required by staff             Psychosocial Re-Evaluation:   Psychosocial Discharge (Final Psychosocial Re-Evaluation):   Vocational Rehabilitation: Provide vocational rehab assistance to qualifying candidates.   Vocational Rehab Evaluation & Intervention:   Education: Education Goals: Education classes will be provided on a variety of topics geared toward better understanding of heart health and risk factor modification. Participant will state understanding/return demonstration of topics presented as noted by education test scores.  Learning Barriers/Preferences:  Learning Barriers/Preferences - 11/11/23 1108       Learning Barriers/Preferences   Learning Barriers Sight    Learning Preferences None             General Cardiac Education Topics:  AED/CPR: - Group verbal and written instruction with the use of models to demonstrate the basic use of the AED with the basic ABC's of resuscitation.   Anatomy and Cardiac Procedures: - Group verbal and visual presentation and models provide information about basic cardiac anatomy and function. Reviews the testing methods done to diagnose heart disease and the outcomes of the test results.  Describes the treatment choices: Medical Management, Angioplasty, or Coronary Bypass Surgery for treating various heart conditions including Myocardial Infarction, Angina, Valve Disease, and Cardiac Arrhythmias.  Written material given at graduation.   Medication Safety: - Group verbal and visual instruction to review commonly prescribed medications for heart and lung disease. Reviews the medication, class of the drug, and side effects. Includes the steps to properly store meds and maintain the prescription regimen.  Written material given at graduation.   Intimacy: - Group verbal instruction through game format to discuss how heart and lung disease can affect sexual intimacy. Written material given at graduation..   Know Your Numbers and Heart Failure: - Group verbal and visual instruction to discuss disease risk  factors for cardiac and pulmonary disease and treatment options.  Reviews associated critical values for Overweight/Obesity, Hypertension, Cholesterol, and Diabetes.  Discusses basics of heart failure: signs/symptoms and treatments.  Introduces Heart Failure Zone chart for action plan for heart failure.  Written material given at graduation.   Infection Prevention: - Provides verbal and written material to individual with discussion of infection control including proper hand washing and proper equipment cleaning during exercise session. Flowsheet Row Cardiac Rehab from 11/13/2023 in [facility] Cardiac and Pulmonary Rehab  Date 11/13/23  Educator [facility]  Instruction Review Code 1- Verbalizes Understanding       Falls Prevention: - Provides verbal and written material to individual with discussion of falls prevention and safety. Flowsheet Row Cardiac Rehab from 11/13/2023 in [facility] Cardiac and Pulmonary Rehab  Date 11/13/23  Educator [facility]  Instruction Review Code 1- Verbalizes Understanding       Other: -Provides group and verbal instruction on various topics (see comments)   Knowledge  Questionnaire Score:  Knowledge Questionnaire Score - 11/21/23 1642       Knowledge Questionnaire Score   Pre Score 25/26             Core Components/Risk Factors/Patient Goals at Admission:  Personal Goals and Risk Factors at Admission - 11/11/23 1108       Core Components/Risk Factors/Patient Goals on Admission    Weight Management Yes;Weight Loss    Intervention Weight Management: Develop a combined nutrition and exercise program designed to reach desired caloric intake, while maintaining appropriate intake of nutrient and fiber, sodium and fats, and appropriate energy expenditure required for the weight goal.;Weight Management: Provide education and appropriate resources to help participant work on and attain dietary goals.;Weight Management/Obesity: Establish reasonable short term and long term weight goals.    Expected Outcomes Short Term: Continue to assess and modify interventions until short term weight is achieved;Long Term: Adherence to nutrition and physical activity/exercise program aimed toward attainment of established weight goal;Weight Loss: Understanding of general recommendations for a balanced deficit meal plan, which promotes 1-2 lb weight loss per week and includes a negative energy balance of [phone] kcal/d;Understanding recommendations for meals to include 15-35% energy as protein, 25-35% energy from fat, 35-60% energy from carbohydrates, less than 200mg  of dietary cholesterol, 20-35 gm of total fiber daily;Understanding of distribution of calorie intake throughout the day with the consumption of 4-5 meals/snacks    Hypertension Yes    Intervention Provide education on lifestyle modifcations including regular physical activity/exercise, weight management, moderate sodium restriction and increased consumption of fresh fruit, vegetables, and low fat dairy, alcohol moderation, and smoking cessation.;Monitor prescription use compliance.    Expected Outcomes Short Term:  Continued assessment and intervention until BP is < 140/23mm HG in hypertensive participants. < 130/49mm HG in hypertensive participants with diabetes, heart failure or chronic kidney disease.;Long Term: Maintenance of blood pressure at goal levels.    Lipids Yes    Intervention Provide education and support for participant on nutrition & aerobic/resistive exercise along with prescribed medications to achieve LDL 70mg , HDL >40mg .    Expected Outcomes Long Term: Cholesterol controlled with medications as prescribed, with individualized exercise RX and with personalized nutrition plan. Value goals: LDL < 70mg , HDL > 40 mg.;Short Term: Participant states understanding of desired cholesterol values and is compliant with medications prescribed. Participant is following exercise prescription and nutrition guidelines.             Education:Diabetes - Individual verbal and written instruction to review signs/symptoms of  diabetes, desired ranges of glucose level fasting, after meals and with exercise. Acknowledge that pre and post exercise glucose checks will be done for 3 sessions at entry of program.   Core Components/Risk Factors/Patient Goals Review:    Core Components/Risk Factors/Patient Goals at Discharge (Final Review):    ITP Comments:  ITP Comments     Row Name 11/11/23 1111 11/13/23 1152 11/19/23 1106 12/05/23 1411     ITP Comments Virtual Visit completed. Patient informed on EP and RD appointment and 6 Minute walk test. Patient also informed of patient health questionnaires on My Chart. Patient Verbalizes understanding. Visit diagnosis can be found in [facility] 08/31/2023. Completed and gym orientation. Initial ITP created and sent for review to Dr. Oneil Pinal, Medical Director. First full day of exercise!  Patient was oriented to gym and equipment including functions, settings, policies, and procedures.  Patient's individual exercise prescription and treatment plan were reviewed.  All  starting workloads were established based on the results of the 6 minute walk test done at initial orientation visit.  The plan for exercise progression was also introduced and progression will be customized based on patient's performance and goals. Mr. Boylen called to state he will need to be discharged due to his work schedule. They are unable to give him a schedule that cooperates with the program. He completed 5/36 sessions.             Comments: Early Discharge ITP

## 2023-12-05 NOTE — Progress Notes (Signed)
 Early Discharge Summary   Cody Golden  DOB: 09/17/1956  Mr. Faircloth called to state he will need to be discharged due to his work schedule. They are unable to give him a schedule that cooperates with the program. He completed 5/36 sessions.    6 Minute Walk     Row Name 11/13/23 1152         6 Minute Walk   Phase Initial     Distance 1435 feet     Walk Time 6 minutes     # of Rest Breaks 0     MPH 2.71     METS 2.8     RPE 8     Perceived Dyspnea  1     VO2 Peak 9.9     Symptoms No     Resting HR 73 bpm     Resting BP 124/70     Resting Oxygen Saturation  94 %     Exercise Oxygen Saturation  during 6 min walk 99 %     Max Ex. HR 110 bpm     Max Ex. BP 142/72     2 Minute Post BP 130/70

## 2024-02-25 ENCOUNTER — Other Ambulatory Visit: Payer: Self-pay | Admitting: Student

## 2024-02-25 DIAGNOSIS — I7121 Aneurysm of the ascending aorta, without rupture: Secondary | ICD-10-CM

## 2024-03-04 ENCOUNTER — Ambulatory Visit
Admission: RE | Admit: 2024-03-04 | Discharge: 2024-03-04 | Disposition: A | Discharge status: Home / Self Care | Source: Ambulatory Visit | Attending: Student | Admitting: Student

## 2024-03-04 DIAGNOSIS — I7121 Aneurysm of the ascending aorta, without rupture: Secondary | ICD-10-CM | POA: Insufficient documentation

## 2024-03-04 MED ORDER — IOHEXOL 350 MG/ML SOLN
75.0000 mL | Freq: Once | INTRAVENOUS | Status: AC | PRN
  Administered 2024-03-04: 75 mL via INTRAVENOUS
